# Patient Record
Sex: Male | Born: 1970 | Race: White | Hispanic: No | Smoking: Never smoker
Health system: Southern US, Community
[De-identification: ages and names within clinical notes are randomized; demographics above are authoritative.]

## PROBLEM LIST (undated history)

## (undated) DIAGNOSIS — K219 Gastro-esophageal reflux disease without esophagitis: Secondary | ICD-10-CM

## (undated) DIAGNOSIS — K209 Esophagitis, unspecified without bleeding: Secondary | ICD-10-CM

## (undated) DIAGNOSIS — K259 Gastric ulcer, unspecified as acute or chronic, without hemorrhage or perforation: Secondary | ICD-10-CM

## (undated) DIAGNOSIS — I10 Essential (primary) hypertension: Secondary | ICD-10-CM

## (undated) HISTORY — PX: HERNIA REPAIR: SHX51

---

## 2000-09-26 ENCOUNTER — Emergency Department (HOSPITAL_COMMUNITY): Admission: EM | Admit: 2000-09-26 | Discharge: 2000-09-26 | Payer: Self-pay | Admitting: Emergency Medicine

## 2000-10-01 ENCOUNTER — Encounter: Payer: Self-pay | Admitting: *Deleted

## 2000-10-01 ENCOUNTER — Emergency Department (HOSPITAL_COMMUNITY): Admission: EM | Admit: 2000-10-01 | Discharge: 2000-10-01 | Payer: Self-pay | Admitting: Emergency Medicine

## 2001-07-30 ENCOUNTER — Ambulatory Visit (HOSPITAL_COMMUNITY): Admission: RE | Admit: 2001-07-30 | Discharge: 2001-07-30 | Payer: Self-pay | Admitting: Internal Medicine

## 2001-07-30 ENCOUNTER — Encounter: Payer: Self-pay | Admitting: Internal Medicine

## 2001-10-07 ENCOUNTER — Emergency Department (HOSPITAL_COMMUNITY): Admission: EM | Admit: 2001-10-07 | Discharge: 2001-10-08 | Payer: Self-pay | Admitting: Emergency Medicine

## 2002-06-18 ENCOUNTER — Emergency Department (HOSPITAL_COMMUNITY): Admission: EM | Admit: 2002-06-18 | Discharge: 2002-06-18 | Payer: Self-pay | Admitting: *Deleted

## 2002-09-16 ENCOUNTER — Encounter: Payer: Self-pay | Admitting: *Deleted

## 2002-09-16 ENCOUNTER — Emergency Department (HOSPITAL_COMMUNITY): Admission: EM | Admit: 2002-09-16 | Discharge: 2002-09-16 | Payer: Self-pay | Admitting: *Deleted

## 2003-04-29 ENCOUNTER — Emergency Department (HOSPITAL_COMMUNITY): Admission: EM | Admit: 2003-04-29 | Discharge: 2003-04-30 | Payer: Self-pay | Admitting: *Deleted

## 2005-01-17 ENCOUNTER — Emergency Department: Payer: Self-pay | Admitting: Unknown Physician Specialty

## 2007-03-06 ENCOUNTER — Emergency Department (HOSPITAL_COMMUNITY): Admission: EM | Admit: 2007-03-06 | Discharge: 2007-03-06 | Payer: Self-pay | Admitting: Emergency Medicine

## 2007-08-17 ENCOUNTER — Emergency Department (HOSPITAL_COMMUNITY): Admission: EM | Admit: 2007-08-17 | Discharge: 2007-08-17 | Payer: Self-pay | Admitting: Emergency Medicine

## 2008-07-08 ENCOUNTER — Emergency Department (HOSPITAL_COMMUNITY): Admission: EM | Admit: 2008-07-08 | Discharge: 2008-07-08 | Payer: Self-pay | Admitting: Emergency Medicine

## 2009-07-25 ENCOUNTER — Emergency Department: Payer: Self-pay | Admitting: Emergency Medicine

## 2009-08-16 ENCOUNTER — Emergency Department (HOSPITAL_COMMUNITY): Admission: EM | Admit: 2009-08-16 | Discharge: 2009-08-16 | Payer: Self-pay | Admitting: Emergency Medicine

## 2009-09-20 ENCOUNTER — Inpatient Hospital Stay (HOSPITAL_COMMUNITY): Admission: AC | Admit: 2009-09-20 | Discharge: 2009-09-22 | Payer: Self-pay

## 2010-08-17 LAB — BLOOD GAS, ARTERIAL
Acid-Base Excess: 2.5 mmol/L — ABNORMAL HIGH (ref 0.0–2.0)
Acid-base deficit: 3.8 mmol/L — ABNORMAL HIGH (ref 0.0–2.0)
Bicarbonate: 20.3 mEq/L (ref 20.0–24.0)
Bicarbonate: 26.7 mEq/L — ABNORMAL HIGH (ref 20.0–24.0)
Drawn by: 296031
Drawn by: 317771
FIO2: 0.3 %
FIO2: 30 %
MECHVT: 550 mL
O2 Saturation: 98.5 %
O2 Saturation: 98.7 %
PEEP: 5 cmH2O
PEEP: 5 cmH2O
Patient temperature: 100
Patient temperature: 98.6
Pressure support: 5 cmH2O
RATE: 16 resp/min
TCO2: 21.4 mmol/L (ref 0–100)
TCO2: 28 mmol/L (ref 0–100)
pCO2 arterial: 36.1 mmHg (ref 35.0–45.0)
pCO2 arterial: 42.4 mmHg (ref 35.0–45.0)
pH, Arterial: 7.374 (ref 7.350–7.450)
pH, Arterial: 7.416 (ref 7.350–7.450)
pO2, Arterial: 106 mmHg — ABNORMAL HIGH (ref 80.0–100.0)
pO2, Arterial: 88.7 mmHg (ref 80.0–100.0)

## 2010-08-17 LAB — POCT I-STAT, CHEM 8
BUN: 10 mg/dL (ref 6–23)
Calcium, Ion: 1.04 mmol/L — ABNORMAL LOW (ref 1.12–1.32)
Chloride: 105 meq/L (ref 96–112)
Creatinine, Ser: 0.9 mg/dL (ref 0.4–1.5)
Glucose, Bld: 126 mg/dL — ABNORMAL HIGH (ref 70–99)
HCT: 46 % (ref 39.0–52.0)
Hemoglobin: 15.6 g/dL (ref 13.0–17.0)
Potassium: 3.8 mEq/L (ref 3.5–5.1)
Sodium: 143 mEq/L (ref 135–145)
TCO2: 26 mmol/L (ref 0–100)

## 2010-08-17 LAB — DIFFERENTIAL
Basophils Absolute: 0 10*3/uL (ref 0.0–0.1)
Basophils Relative: 0 % (ref 0–1)
Eosinophils Absolute: 0 10*3/uL (ref 0.0–0.7)
Eosinophils Relative: 0 % (ref 0–5)
Lymphocytes Relative: 13 % (ref 12–46)
Lymphs Abs: 2 10*3/uL (ref 0.7–4.0)
Monocytes Absolute: 1.1 10*3/uL — ABNORMAL HIGH (ref 0.1–1.0)
Monocytes Relative: 7 % (ref 3–12)
Neutro Abs: 12.5 10*3/uL — ABNORMAL HIGH (ref 1.7–7.7)
Neutrophils Relative %: 80 % — ABNORMAL HIGH (ref 43–77)

## 2010-08-17 LAB — COMPREHENSIVE METABOLIC PANEL
ALT: 13 U/L (ref 0–53)
AST: 26 U/L (ref 0–37)
Albumin: 4 g/dL (ref 3.5–5.2)
Alkaline Phosphatase: 49 U/L (ref 39–117)
BUN: 8 mg/dL (ref 6–23)
CO2: 26 mEq/L (ref 19–32)
Calcium: 8.3 mg/dL — ABNORMAL LOW (ref 8.4–10.5)
Chloride: 106 mEq/L (ref 96–112)
Creatinine, Ser: 0.83 mg/dL (ref 0.4–1.5)
GFR calc Af Amer: >60 mL/min (ref >60)
GFR calc non Af Amer: >60 mL/min (ref >60)
Glucose, Bld: 130 mg/dL — ABNORMAL HIGH (ref 70–99)
Potassium: 3.7 mEq/L (ref 3.5–5.1)
Sodium: 140 mEq/L (ref 135–145)
Total Bilirubin: 0.6 mg/dL (ref 0.3–1.2)
Total Protein: 7 g/dL (ref 6.0–8.3)

## 2010-08-17 LAB — BASIC METABOLIC PANEL
BUN: 8 mg/dL (ref 6–23)
CO2: 26 mEq/L (ref 19–32)
Calcium: 8.6 mg/dL (ref 8.4–10.5)
Chloride: 106 mEq/L (ref 96–112)
Creatinine, Ser: 0.78 mg/dL (ref 0.4–1.5)
GFR calc Af Amer: >60 mL/min (ref >60)
GFR calc non Af Amer: >60 mL/min (ref >60)
Glucose, Bld: 107 mg/dL — ABNORMAL HIGH (ref 70–99)
Potassium: 4.2 mEq/L (ref 3.5–5.1)
Sodium: 143 mEq/L (ref 135–145)

## 2010-08-17 LAB — CBC
HCT: 38.2 % — ABNORMAL LOW (ref 39.0–52.0)
HCT: 41.4 % (ref 39.0–52.0)
Hemoglobin: 13.4 g/dL (ref 13.0–17.0)
Hemoglobin: 13.7 g/dL (ref 13.0–17.0)
MCHC: 33.2 g/dL (ref 30.0–36.0)
MCHC: 35.2 g/dL (ref 30.0–36.0)
MCV: 85.2 fL (ref 78.0–100.0)
MCV: 86.1 fL (ref 78.0–100.0)
Platelets: 295 10*3/uL (ref 150–400)
Platelets: 327 10*3/uL (ref 150–400)
RBC: 4.48 MIL/uL (ref 4.22–5.81)
RBC: 4.8 MIL/uL (ref 4.22–5.81)
RDW: 14.7 % (ref 11.5–15.5)
RDW: 14.9 % (ref 11.5–15.5)
WBC: 15.6 10*3/uL — ABNORMAL HIGH (ref 4.0–10.5)
WBC: 18.8 10*3/uL — ABNORMAL HIGH (ref 4.0–10.5)

## 2010-08-17 LAB — MRSA PCR SCREENING: MRSA by PCR: NEGATIVE

## 2010-08-17 LAB — URINALYSIS, ROUTINE W REFLEX MICROSCOPIC
Bilirubin Urine: NEGATIVE
Glucose, UA: NEGATIVE mg/dL
Hgb urine dipstick: NEGATIVE
Ketones, ur: NEGATIVE mg/dL
Nitrite: NEGATIVE
Protein, ur: NEGATIVE mg/dL
Specific Gravity, Urine: 1.013 (ref 1.005–1.030)
Urobilinogen, UA: 0.2 mg/dL (ref 0.0–1.0)
pH: 5 (ref 5.0–8.0)

## 2010-08-17 LAB — RAPID URINE DRUG SCREEN, HOSP PERFORMED
Amphetamines: NOT DETECTED
Barbiturates: NOT DETECTED
Benzodiazepines: POSITIVE — AB
Cocaine: NOT DETECTED
Opiates: NOT DETECTED
Tetrahydrocannabinol: NOT DETECTED

## 2010-08-17 LAB — PROTIME-INR
INR: 1 (ref 0.00–1.49)
Prothrombin Time: 13.1 seconds (ref 11.6–15.2)

## 2010-08-17 LAB — ETHANOL: Alcohol, Ethyl (B): 192 mg/dL — ABNORMAL HIGH (ref 0–10)

## 2010-08-17 LAB — APTT: aPTT: 23 seconds — ABNORMAL LOW (ref 24–37)

## 2010-08-17 LAB — LACTIC ACID, PLASMA: Lactic Acid, Venous: 6 mmol/L — ABNORMAL HIGH (ref 0.5–2.2)

## 2010-09-14 LAB — RAPID URINE DRUG SCREEN, HOSP PERFORMED
Amphetamines: NOT DETECTED
Barbiturates: NOT DETECTED
Benzodiazepines: POSITIVE — AB
Cocaine: NOT DETECTED
Opiates: NOT DETECTED
Tetrahydrocannabinol: NOT DETECTED

## 2010-09-14 LAB — COMPREHENSIVE METABOLIC PANEL
ALT: 21 U/L (ref 0–53)
AST: 31 U/L (ref 0–37)
Albumin: 4.4 g/dL (ref 3.5–5.2)
Alkaline Phosphatase: 66 U/L (ref 39–117)
BUN: 11 mg/dL (ref 6–23)
CO2: 29 mEq/L (ref 19–32)
Calcium: 10 mg/dL (ref 8.4–10.5)
Chloride: 96 mEq/L (ref 96–112)
Creatinine, Ser: 0.84 mg/dL (ref 0.4–1.5)
GFR calc Af Amer: >60 mL/min (ref >60)
GFR calc non Af Amer: >60 mL/min (ref >60)
Glucose, Bld: 123 mg/dL — ABNORMAL HIGH (ref 70–99)
Potassium: 3.8 mEq/L (ref 3.5–5.1)
Sodium: 137 mEq/L (ref 135–145)
Total Bilirubin: 1 mg/dL (ref 0.3–1.2)
Total Protein: 7.8 g/dL (ref 6.0–8.3)

## 2010-09-14 LAB — GLUCOSE, CAPILLARY: Glucose-Capillary: 118 mg/dL — ABNORMAL HIGH (ref 70–99)

## 2010-09-14 LAB — ETHANOL: Alcohol, Ethyl (B): <5 mg/dL (ref 0–10)

## 2010-09-14 LAB — URINALYSIS, ROUTINE W REFLEX MICROSCOPIC
Bilirubin Urine: NEGATIVE
Glucose, UA: NEGATIVE mg/dL
Ketones, ur: NEGATIVE mg/dL
Leukocytes, UA: NEGATIVE
Nitrite: NEGATIVE
Protein, ur: NEGATIVE mg/dL
Specific Gravity, Urine: >1.03 — ABNORMAL HIGH (ref 1.005–1.030)
Urobilinogen, UA: 0.2 mg/dL (ref 0.0–1.0)
pH: 5 (ref 5.0–8.0)

## 2010-09-14 LAB — CBC
HCT: 48 % (ref 39.0–52.0)
Hemoglobin: 16.5 g/dL (ref 13.0–17.0)
MCHC: 34.4 g/dL (ref 30.0–36.0)
MCV: 88.4 fL (ref 78.0–100.0)
Platelets: 358 10*3/uL (ref 150–400)
RBC: 5.44 MIL/uL (ref 4.22–5.81)
RDW: 14.1 % (ref 11.5–15.5)
WBC: 11.7 10*3/uL — ABNORMAL HIGH (ref 4.0–10.5)

## 2010-09-14 LAB — URINE MICROSCOPIC-ADD ON

## 2010-09-14 LAB — DIFFERENTIAL
Basophils Absolute: 0 10*3/uL (ref 0.0–0.1)
Basophils Relative: 0 % (ref 0–1)
Eosinophils Absolute: 0 10*3/uL (ref 0.0–0.7)
Eosinophils Relative: 0 % (ref 0–5)
Lymphocytes Relative: 10 % — ABNORMAL LOW (ref 12–46)
Lymphs Abs: 1.2 10*3/uL (ref 0.7–4.0)
Monocytes Absolute: 0.6 10*3/uL (ref 0.1–1.0)
Monocytes Relative: 5 % (ref 3–12)
Neutro Abs: 9.9 10*3/uL — ABNORMAL HIGH (ref 1.7–7.7)
Neutrophils Relative %: 84 % — ABNORMAL HIGH (ref 43–77)

## 2011-02-21 LAB — DIFFERENTIAL
Basophils Absolute: 0
Basophils Relative: 0
Eosinophils Absolute: 0
Eosinophils Relative: 0
Lymphocytes Relative: 3 — ABNORMAL LOW
Lymphs Abs: 0.6 — ABNORMAL LOW
Monocytes Absolute: 0.5
Monocytes Relative: 3
Neutro Abs: 18.9 — ABNORMAL HIGH
Neutrophils Relative %: 94 — ABNORMAL HIGH

## 2011-02-21 LAB — BASIC METABOLIC PANEL
BUN: 11
CO2: 25
Calcium: 9.6
Chloride: 102
Creatinine, Ser: 0.98
GFR calc Af Amer: >60
GFR calc non Af Amer: >60
Glucose, Bld: 119 — ABNORMAL HIGH
Potassium: 4
Sodium: 137

## 2011-02-21 LAB — POCT CARDIAC MARKERS
CKMB, poc: <1 — ABNORMAL LOW
Myoglobin, poc: 35.2
Operator id: 286941
Troponin i, poc: <0.05

## 2011-02-21 LAB — CBC
HCT: 41.9
Hemoglobin: 14.7
MCHC: 35.1
MCV: 86.3
Platelets: 305
RBC: 4.86
RDW: 14.9
WBC: 20.1 — ABNORMAL HIGH

## 2011-03-28 ENCOUNTER — Emergency Department (HOSPITAL_COMMUNITY)
Admission: EM | Admit: 2011-03-28 | Discharge: 2011-03-29 | Discharge status: Against Medical Advice | Payer: Self-pay | Attending: Emergency Medicine | Admitting: Emergency Medicine

## 2011-03-28 DIAGNOSIS — Z5320 Procedure and treatment not carried out because of patient's decision for unspecified reasons: Secondary | ICD-10-CM | POA: Insufficient documentation

## 2011-03-28 DIAGNOSIS — G4700 Insomnia, unspecified: Secondary | ICD-10-CM | POA: Insufficient documentation

## 2011-03-28 DIAGNOSIS — R059 Cough, unspecified: Secondary | ICD-10-CM | POA: Insufficient documentation

## 2011-03-28 DIAGNOSIS — R05 Cough: Secondary | ICD-10-CM | POA: Insufficient documentation

## 2011-03-29 ENCOUNTER — Encounter: Payer: Self-pay | Admitting: *Deleted

## 2011-03-29 NOTE — ED Notes (Signed)
Cough x1 month and insomnia

## 2011-04-27 ENCOUNTER — Encounter (HOSPITAL_COMMUNITY): Payer: Self-pay | Admitting: Emergency Medicine

## 2011-04-27 ENCOUNTER — Emergency Department (HOSPITAL_COMMUNITY)
Admission: EM | Admit: 2011-04-27 | Discharge: 2011-04-27 | Disposition: A | Discharge status: Home / Self Care | Payer: Self-pay | Attending: Emergency Medicine | Admitting: Emergency Medicine

## 2011-04-27 DIAGNOSIS — R197 Diarrhea, unspecified: Secondary | ICD-10-CM | POA: Insufficient documentation

## 2011-04-27 DIAGNOSIS — R1013 Epigastric pain: Secondary | ICD-10-CM | POA: Insufficient documentation

## 2011-04-27 DIAGNOSIS — R5381 Other malaise: Secondary | ICD-10-CM | POA: Insufficient documentation

## 2011-04-27 DIAGNOSIS — R112 Nausea with vomiting, unspecified: Secondary | ICD-10-CM | POA: Insufficient documentation

## 2011-04-27 DIAGNOSIS — K259 Gastric ulcer, unspecified as acute or chronic, without hemorrhage or perforation: Secondary | ICD-10-CM | POA: Insufficient documentation

## 2011-04-27 HISTORY — DX: Gastric ulcer, unspecified as acute or chronic, without hemorrhage or perforation: K25.9

## 2011-04-27 LAB — CBC
HCT: 46.8 % (ref 39.0–52.0)
Hemoglobin: 15.8 g/dL (ref 13.0–17.0)
MCH: 29.8 pg (ref 26.0–34.0)
MCHC: 33.8 g/dL (ref 30.0–36.0)
MCV: 88.3 fL (ref 78.0–100.0)
Platelets: 325 10*3/uL (ref 150–400)
RBC: 5.3 MIL/uL (ref 4.22–5.81)
RDW: 13.3 % (ref 11.5–15.5)
WBC: 8.3 10*3/uL (ref 4.0–10.5)

## 2011-04-27 LAB — DIFFERENTIAL
Basophils Absolute: 0 10*3/uL (ref 0.0–0.1)
Basophils Relative: 0 % (ref 0–1)
Eosinophils Absolute: 0 10*3/uL (ref 0.0–0.7)
Eosinophils Relative: 1 % (ref 0–5)
Lymphocytes Relative: 21 % (ref 12–46)
Lymphs Abs: 1.8 10*3/uL (ref 0.7–4.0)
Monocytes Absolute: 0.5 10*3/uL (ref 0.1–1.0)
Monocytes Relative: 6 % (ref 3–12)
Neutro Abs: 6 10*3/uL (ref 1.7–7.7)
Neutrophils Relative %: 72 % (ref 43–77)

## 2011-04-27 LAB — COMPREHENSIVE METABOLIC PANEL
ALT: 13 U/L (ref 0–53)
AST: 16 U/L (ref 0–37)
Albumin: 4.2 g/dL (ref 3.5–5.2)
Alkaline Phosphatase: 61 U/L (ref 39–117)
BUN: 12 mg/dL (ref 6–23)
CO2: 28 mEq/L (ref 19–32)
Calcium: 10 mg/dL (ref 8.4–10.5)
Chloride: 100 mEq/L (ref 96–112)
Creatinine, Ser: 0.77 mg/dL (ref 0.50–1.35)
GFR calc Af Amer: >90 mL/min (ref >90)
GFR calc non Af Amer: >90 mL/min (ref >90)
Glucose, Bld: 88 mg/dL (ref 70–99)
Potassium: 3.9 mEq/L (ref 3.5–5.1)
Sodium: 138 mEq/L (ref 135–145)
Total Bilirubin: 0.4 mg/dL (ref 0.3–1.2)
Total Protein: 7.8 g/dL (ref 6.0–8.3)

## 2011-04-27 LAB — LIPASE, BLOOD: Lipase: 35 U/L (ref 11–59)

## 2011-04-27 MED ORDER — SODIUM CHLORIDE 0.9 % IV BOLUS (SEPSIS)
1000.0000 mL | Freq: Once | INTRAVENOUS | Status: AC
  Administered 2011-04-27: 1000 mL via INTRAVENOUS

## 2011-04-27 MED ORDER — ONDANSETRON 8 MG PO TBDP: 8.0000 mg | ORAL_TABLET | Freq: Three times a day (TID) | ORAL | Status: AC | PRN

## 2011-04-27 MED ORDER — ONDANSETRON HCL 4 MG/2ML IJ SOLN
4.0000 mg | Freq: Once | INTRAMUSCULAR | Status: AC
  Administered 2011-04-27: 4 mg via INTRAVENOUS
  Filled 2011-04-27: qty 2

## 2011-04-27 NOTE — ED Notes (Signed)
Patient with c/o epigastric pain starting Sunday. History of ulcers. +n/v.

## 2011-04-27 NOTE — ED Provider Notes (Signed)
History     CSN: [id number] Arrival date & time: 04/27/2011  3:52 PM   First MD Initiated Contact with Patient 04/27/11 1554      Chief Complaint  Patient presents with  . Abdominal Pain    (Consider location/radiation/quality/duration/timing/severity/associated sxs/prior treatment) Patient is a 40 y.o. male presenting with abdominal pain. The history is provided by the patient.  Abdominal Pain The primary symptoms of the illness include abdominal pain, fatigue, nausea, vomiting and diarrhea. The primary symptoms of the illness do not include shortness of breath.  Symptoms associated with the illness do not include back pain.   patient developed nausea vomiting diarrhea. Insert on Friday. He developed more epigastric pain radiating to his chest on Sunday. He does have a history of ulcers. He states the diarrhea has slowed down and now is not able to eat due to nausea since. No fevers. He describes the pain as cramping and burning. No dyspnea. No diaphoresis. He states he has thrown up a little bit of blood. He is an occasional alcohol user, but not recently. The pain is constant.   Past Medical History  Diagnosis Date  . Stomach ulcer     Past Surgical History  Procedure Date  . Hernia repair     No family history on file.  History  Substance Use Topics  . Smoking status: Never Smoker   . Smokeless tobacco: Not on file  . Alcohol Use: Yes      Review of Systems  Constitutional: Positive for appetite change and fatigue. Negative for activity change.  HENT: Negative for neck stiffness.   Eyes: Negative for pain.  Respiratory: Negative for chest tightness and shortness of breath.   Cardiovascular: Negative for chest pain and leg swelling.  Gastrointestinal: Positive for nausea, vomiting, abdominal pain and diarrhea.  Genitourinary: Negative for flank pain.  Musculoskeletal: Negative for back pain.  Skin: Negative for rash.  Neurological: Negative for weakness,  numbness and headaches.  Psychiatric/Behavioral: Negative for behavioral problems.    Allergies  Review of patient's allergies indicates no known allergies.  Home Medications   Current Outpatient Rx  Name Route Sig Dispense Refill  . DIPHENHYDRAMINE-APAP (SLEEP) 25-500 MG PO TABS Oral Take 2 tablets by mouth at bedtime as needed. For sleep/pain     . ONDANSETRON 8 MG PO TBDP Oral Take 1 tablet (8 mg total) by mouth every 8 (eight) hours as needed for nausea. 20 tablet 0    BP 156/101  Pulse 89  Temp(Src) 98.2 F (36.8 C) (Oral)  Resp 18  Ht 6\' 1"  (1.854 m)  Wt 220 lb (99.791 kg)  BMI 29.03 kg/m2  SpO2 100%  Physical Exam  Nursing note and vitals reviewed. Constitutional: He is oriented to person, place, and time. He appears well-developed and well-nourished.  HENT:  Head: Normocephalic and atraumatic.  Neck: Normal range of motion. Neck supple.  Cardiovascular: Normal rate, regular rhythm and normal heart sounds.   No murmur heard. Pulmonary/Chest: Effort normal and breath sounds normal.  Abdominal: Soft. Bowel sounds are normal. He exhibits no distension and no mass. There is tenderness. There is no rebound and no guarding.       Mild epigastric tenderness  Musculoskeletal: Normal range of motion. He exhibits no edema.  Neurological: He is alert and oriented to person, place, and time. No cranial nerve deficit.  Skin: Skin is warm and dry.  Psychiatric: He has a normal mood and affect.    ED Course  Procedures (including critical  care time)   Labs Reviewed  CBC  DIFFERENTIAL  COMPREHENSIVE METABOLIC PANEL  LIPASE, BLOOD   No results found.   1. Nausea vomiting and diarrhea   2. Abdominal pain       MDM  Nausea vomiting diarrhea with epigastric pain. Laboratory reassuring. Patient feels better after IV fluids. He is tolerating orals here. He'll be discharged with oral Zofran.        Juliet Rude. Rubin Payor, MD 04/27/11 [phone]

## 2011-10-26 ENCOUNTER — Encounter (HOSPITAL_COMMUNITY): Payer: Self-pay | Admitting: Emergency Medicine

## 2011-10-26 ENCOUNTER — Inpatient Hospital Stay (HOSPITAL_COMMUNITY)
Admission: EM | Admit: 2011-10-26 | Discharge: 2011-10-28 | DRG: 392 | Disposition: A | Discharge status: Home / Self Care | Payer: MEDICAID | Attending: Internal Medicine | Admitting: Internal Medicine

## 2011-10-26 DIAGNOSIS — R918 Other nonspecific abnormal finding of lung field: Secondary | ICD-10-CM | POA: Diagnosis present

## 2011-10-26 DIAGNOSIS — K279 Peptic ulcer, site unspecified, unspecified as acute or chronic, without hemorrhage or perforation: Secondary | ICD-10-CM | POA: Diagnosis present

## 2011-10-26 DIAGNOSIS — Z9119 Patient's noncompliance with other medical treatment and regimen: Secondary | ICD-10-CM

## 2011-10-26 DIAGNOSIS — K209 Esophagitis, unspecified without bleeding: Principal | ICD-10-CM | POA: Diagnosis present

## 2011-10-26 DIAGNOSIS — Z91199 Patient's noncompliance with other medical treatment and regimen due to unspecified reason: Secondary | ICD-10-CM

## 2011-10-26 DIAGNOSIS — R000 Tachycardia, unspecified: Secondary | ICD-10-CM | POA: Diagnosis present

## 2011-10-26 DIAGNOSIS — K219 Gastro-esophageal reflux disease without esophagitis: Secondary | ICD-10-CM | POA: Diagnosis present

## 2011-10-26 DIAGNOSIS — I10 Essential (primary) hypertension: Secondary | ICD-10-CM | POA: Diagnosis present

## 2011-10-26 DIAGNOSIS — R911 Solitary pulmonary nodule: Secondary | ICD-10-CM

## 2011-10-26 DIAGNOSIS — R52 Pain, unspecified: Secondary | ICD-10-CM

## 2011-10-26 DIAGNOSIS — K920 Hematemesis: Secondary | ICD-10-CM | POA: Diagnosis present

## 2011-10-26 DIAGNOSIS — E860 Dehydration: Secondary | ICD-10-CM

## 2011-10-26 DIAGNOSIS — Z8711 Personal history of peptic ulcer disease: Secondary | ICD-10-CM

## 2011-10-26 DIAGNOSIS — E871 Hypo-osmolality and hyponatremia: Secondary | ICD-10-CM | POA: Diagnosis present

## 2011-10-26 HISTORY — DX: Essential (primary) hypertension: I10

## 2011-10-26 HISTORY — DX: Esophagitis, unspecified without bleeding: K20.90

## 2011-10-26 HISTORY — DX: Gastro-esophageal reflux disease without esophagitis: K21.9

## 2011-10-26 HISTORY — DX: Esophagitis, unspecified: K20.9

## 2011-10-26 NOTE — ED Notes (Signed)
Patient c/o not being able to eat and states went to bathroom tonight and had dark, black stool.

## 2011-10-27 ENCOUNTER — Emergency Department (HOSPITAL_COMMUNITY): Payer: Self-pay

## 2011-10-27 ENCOUNTER — Encounter (HOSPITAL_COMMUNITY)
Admission: EM | Disposition: A | Discharge status: Home / Self Care | Payer: Self-pay | Source: Home / Self Care | Attending: Internal Medicine

## 2011-10-27 ENCOUNTER — Encounter (HOSPITAL_COMMUNITY): Payer: Self-pay | Admitting: Internal Medicine

## 2011-10-27 DIAGNOSIS — K208 Other esophagitis without bleeding: Secondary | ICD-10-CM

## 2011-10-27 DIAGNOSIS — I10 Essential (primary) hypertension: Secondary | ICD-10-CM

## 2011-10-27 DIAGNOSIS — R918 Other nonspecific abnormal finding of lung field: Secondary | ICD-10-CM | POA: Diagnosis present

## 2011-10-27 DIAGNOSIS — K449 Diaphragmatic hernia without obstruction or gangrene: Secondary | ICD-10-CM

## 2011-10-27 DIAGNOSIS — K219 Gastro-esophageal reflux disease without esophagitis: Secondary | ICD-10-CM | POA: Diagnosis present

## 2011-10-27 DIAGNOSIS — K279 Peptic ulcer, site unspecified, unspecified as acute or chronic, without hemorrhage or perforation: Secondary | ICD-10-CM | POA: Diagnosis present

## 2011-10-27 DIAGNOSIS — E871 Hypo-osmolality and hyponatremia: Secondary | ICD-10-CM

## 2011-10-27 DIAGNOSIS — K920 Hematemesis: Secondary | ICD-10-CM | POA: Diagnosis present

## 2011-10-27 DIAGNOSIS — E860 Dehydration: Secondary | ICD-10-CM

## 2011-10-27 HISTORY — PX: ESOPHAGOGASTRODUODENOSCOPY: SHX5428

## 2011-10-27 LAB — CBC
HCT: 40.9 % (ref 39.0–52.0)
HCT: 43.8 % (ref 39.0–52.0)
Hemoglobin: 13.6 g/dL (ref 13.0–17.0)
Hemoglobin: 14.9 g/dL (ref 13.0–17.0)
MCH: 29.9 pg (ref 26.0–34.0)
MCH: 30.4 pg (ref 26.0–34.0)
MCHC: 33.3 g/dL (ref 30.0–36.0)
MCHC: 34 g/dL (ref 30.0–36.0)
MCV: 89.4 fL (ref 78.0–100.0)
MCV: 89.9 fL (ref 78.0–100.0)
Platelets: 298 10*3/uL (ref 150–400)
Platelets: 346 10*3/uL (ref 150–400)
RBC: 4.55 MIL/uL (ref 4.22–5.81)
RBC: 4.9 MIL/uL (ref 4.22–5.81)
RDW: 12.8 % (ref 11.5–15.5)
RDW: 12.9 % (ref 11.5–15.5)
WBC: 14.7 10*3/uL — ABNORMAL HIGH (ref 4.0–10.5)
WBC: 15.5 10*3/uL — ABNORMAL HIGH (ref 4.0–10.5)

## 2011-10-27 LAB — URINALYSIS, ROUTINE W REFLEX MICROSCOPIC
Bilirubin Urine: NEGATIVE
Glucose, UA: NEGATIVE mg/dL
Hgb urine dipstick: NEGATIVE
Ketones, ur: NEGATIVE mg/dL
Leukocytes, UA: NEGATIVE
Nitrite: NEGATIVE
Protein, ur: NEGATIVE mg/dL
Specific Gravity, Urine: <1.005 — ABNORMAL LOW (ref 1.005–1.030)
Urobilinogen, UA: 0.2 mg/dL (ref 0.0–1.0)
pH: 7 (ref 5.0–8.0)

## 2011-10-27 LAB — PROTIME-INR
INR: 0.91 (ref 0.00–1.49)
Prothrombin Time: 12.5 seconds (ref 11.6–15.2)

## 2011-10-27 LAB — HEPATIC FUNCTION PANEL
ALT: 12 U/L (ref 0–53)
AST: 15 U/L (ref 0–37)
Albumin: 3.6 g/dL (ref 3.5–5.2)
Alkaline Phosphatase: 63 U/L (ref 39–117)
Bilirubin, Direct: 0.1 mg/dL (ref 0.0–0.3)
Indirect Bilirubin: 0.4 mg/dL (ref 0.3–0.9)
Total Bilirubin: 0.5 mg/dL (ref 0.3–1.2)
Total Protein: 7.3 g/dL (ref 6.0–8.3)

## 2011-10-27 LAB — BASIC METABOLIC PANEL
BUN: 7 mg/dL (ref 6–23)
CO2: 26 mEq/L (ref 19–32)
Calcium: 9.8 mg/dL (ref 8.4–10.5)
Chloride: 89 mEq/L — ABNORMAL LOW (ref 96–112)
Creatinine, Ser: 0.69 mg/dL (ref 0.50–1.35)
GFR calc Af Amer: >90 mL/min (ref >90)
GFR calc non Af Amer: >90 mL/min (ref >90)
Glucose, Bld: 102 mg/dL — ABNORMAL HIGH (ref 70–99)
Potassium: 4.2 mEq/L (ref 3.5–5.1)
Sodium: 129 mEq/L — ABNORMAL LOW (ref 135–145)

## 2011-10-27 LAB — DIFFERENTIAL
Basophils Absolute: 0 10*3/uL (ref 0.0–0.1)
Basophils Relative: 0 % (ref 0–1)
Eosinophils Absolute: 0 10*3/uL (ref 0.0–0.7)
Eosinophils Relative: 0 % (ref 0–5)
Lymphocytes Relative: 7 % — ABNORMAL LOW (ref 12–46)
Lymphs Abs: 1 10*3/uL (ref 0.7–4.0)
Monocytes Absolute: 0.4 10*3/uL (ref 0.1–1.0)
Monocytes Relative: 3 % (ref 3–12)
Neutro Abs: 13.3 10*3/uL — ABNORMAL HIGH (ref 1.7–7.7)
Neutrophils Relative %: 90 % — ABNORMAL HIGH (ref 43–77)

## 2011-10-27 LAB — LIPASE, BLOOD: Lipase: 26 U/L (ref 11–59)

## 2011-10-27 LAB — APTT: aPTT: 28 seconds (ref 24–37)

## 2011-10-27 LAB — ETHANOL: Alcohol, Ethyl (B): 63 mg/dL — ABNORMAL HIGH (ref 0–11)

## 2011-10-27 SURGERY — EGD (ESOPHAGOGASTRODUODENOSCOPY)
Anesthesia: Moderate Sedation

## 2011-10-27 MED ORDER — SODIUM CHLORIDE 0.9 % IV BOLUS (SEPSIS)
1000.0000 mL | Freq: Once | INTRAVENOUS | Status: AC
  Administered 2011-10-27: 1000 mL via INTRAVENOUS

## 2011-10-27 MED ORDER — SODIUM CHLORIDE 0.9 % IV SOLN: INTRAVENOUS | Status: AC

## 2011-10-27 MED ORDER — FLEET ENEMA 7-19 GM/118ML RE ENEM: 1.0000 | ENEMA | Freq: Once | RECTAL | Status: AC | PRN

## 2011-10-27 MED ORDER — STERILE WATER FOR IRRIGATION IR SOLN
Status: DC | PRN
  Administered 2011-10-27: 15:00:00

## 2011-10-27 MED ORDER — MIDAZOLAM HCL 5 MG/5ML IJ SOLN
INTRAMUSCULAR | Status: AC
  Filled 2011-10-27: qty 5

## 2011-10-27 MED ORDER — HYDROMORPHONE HCL PF 1 MG/ML IJ SOLN
1.0000 mg | Freq: Once | INTRAMUSCULAR | Status: AC
  Administered 2011-10-27: 1 mg via INTRAVENOUS
  Filled 2011-10-27: qty 1

## 2011-10-27 MED ORDER — POTASSIUM CHLORIDE IN NACL 20-0.9 MEQ/L-% IV SOLN
INTRAVENOUS | Status: DC
  Administered 2011-10-27: 18:00:00 via INTRAVENOUS

## 2011-10-27 MED ORDER — IOHEXOL 300 MG/ML  SOLN
100.0000 mL | Freq: Once | INTRAMUSCULAR | Status: AC | PRN
  Administered 2011-10-27: 100 mL via INTRAVENOUS

## 2011-10-27 MED ORDER — SODIUM CHLORIDE 0.9 % IJ SOLN
INTRAMUSCULAR | Status: AC
  Filled 2011-10-27: qty 10

## 2011-10-27 MED ORDER — MIDAZOLAM HCL 5 MG/5ML IJ SOLN
INTRAMUSCULAR | Status: DC | PRN
  Administered 2011-10-27 (×4): 2 mg via INTRAVENOUS

## 2011-10-27 MED ORDER — PROMETHAZINE HCL 25 MG/ML IJ SOLN
INTRAMUSCULAR | Status: AC
  Filled 2011-10-27: qty 1

## 2011-10-27 MED ORDER — SODIUM CHLORIDE 0.45 % IV SOLN
Freq: Once | INTRAVENOUS | Status: AC
  Administered 2011-10-27: 1000 mL via INTRAVENOUS

## 2011-10-27 MED ORDER — HYDRALAZINE HCL 20 MG/ML IJ SOLN: 2.0000 mg | INTRAMUSCULAR | Status: DC | PRN

## 2011-10-27 MED ORDER — PANTOPRAZOLE SODIUM 40 MG IV SOLR: 40.0000 mg | Freq: Once | INTRAVENOUS | Status: AC

## 2011-10-27 MED ORDER — PANTOPRAZOLE SODIUM 40 MG IV SOLR
40.0000 mg | Freq: Once | INTRAVENOUS | Status: AC
  Administered 2011-10-27: 40 mg via INTRAVENOUS
  Filled 2011-10-27: qty 40

## 2011-10-27 MED ORDER — MEPERIDINE HCL 100 MG/ML IJ SOLN
INTRAMUSCULAR | Status: AC
  Filled 2011-10-27: qty 1

## 2011-10-27 MED ORDER — MORPHINE SULFATE 4 MG/ML IJ SOLN
4.0000 mg | Freq: Once | INTRAMUSCULAR | Status: AC
  Administered 2011-10-27: 4 mg via INTRAVENOUS
  Filled 2011-10-27: qty 1

## 2011-10-27 MED ORDER — ONDANSETRON HCL 4 MG/2ML IJ SOLN
4.0000 mg | INTRAMUSCULAR | Status: DC | PRN
  Administered 2011-10-27: 4 mg via INTRAVENOUS
  Filled 2011-10-27: qty 2

## 2011-10-27 MED ORDER — PROMETHAZINE HCL 25 MG/ML IJ SOLN
INTRAMUSCULAR | Status: DC | PRN
  Administered 2011-10-27 (×2): 12.5 mg via INTRAVENOUS

## 2011-10-27 MED ORDER — BUTAMBEN-TETRACAINE-BENZOCAINE 2-2-14 % EX AERO
INHALATION_SPRAY | CUTANEOUS | Status: DC | PRN
  Administered 2011-10-27: 2 via TOPICAL

## 2011-10-27 MED ORDER — PANTOPRAZOLE SODIUM 40 MG IV SOLR
40.0000 mg | Freq: Two times a day (BID) | INTRAVENOUS | Status: DC
  Administered 2011-10-27 – 2011-10-28 (×2): 40 mg via INTRAVENOUS
  Filled 2011-10-27 (×2): qty 40

## 2011-10-27 MED ORDER — HYDROMORPHONE HCL PF 1 MG/ML IJ SOLN
0.5000 mg | INTRAMUSCULAR | Status: DC | PRN
  Administered 2011-10-27 – 2011-10-28 (×10): 1 mg via INTRAVENOUS
  Filled 2011-10-27 (×10): qty 1

## 2011-10-27 MED ORDER — MEPERIDINE HCL 100 MG/ML IJ SOLN
INTRAMUSCULAR | Status: DC | PRN
  Administered 2011-10-27: 50 mg via INTRAVENOUS
  Administered 2011-10-27: 25 mg via INTRAVENOUS
  Administered 2011-10-27: 50 mg via INTRAVENOUS
  Administered 2011-10-27: 25 mg via INTRAVENOUS

## 2011-10-27 MED ORDER — BISACODYL 10 MG RE SUPP: 10.0000 mg | Freq: Every day | RECTAL | Status: DC | PRN

## 2011-10-27 MED ORDER — MIDAZOLAM HCL 5 MG/5ML IJ SOLN
INTRAMUSCULAR | Status: AC
  Filled 2011-10-27: qty 10

## 2011-10-27 MED ORDER — PANTOPRAZOLE SODIUM 40 MG IV SOLR
40.0000 mg | Freq: Two times a day (BID) | INTRAVENOUS | Status: DC
  Administered 2011-10-27: 40 mg via INTRAVENOUS
  Filled 2011-10-27: qty 40

## 2011-10-27 NOTE — Progress Notes (Signed)
cm

## 2011-10-27 NOTE — Consult Note (Signed)
  Primary Care Physician:  No primary provider on file. Primary Gastroenterologist:  Dr. Darrick Penna  Pre-Procedure History & Physical: HPI:  Timothy Novak is a 41 y.o. male here for  HEMATEMESIS. GERD FOR 14 YEARS NO PREVACID FOR 12 YEARS. SAW STREAKS OF BLOOD YESTERDAY 1X-STREAKS. RARE ETOH-LAST TIME ETOH. NO CIGS. NO OTHER MEDS FOR REFLUX. sX 3-4X/WEEK-BURNING IN ESOPHAGUS AND STOMACH FEELS LIKE IT'S SWELLING/PRESSURE. NO ASAS, BC, GOODY'S, ALEVE, OR IBUPROFEN. ONLY USES TYLENOL PM. NO WEIGHT LOSS.NO PROBLEMS SWALLOWING.   Past Medical History  Diagnosis Date  . Stomach ulcer age 56  . GERD (gastroesophageal reflux disease) age 72  . Hypertension     Past Surgical History  Procedure Date  . Hernia repair RIGHT    Allergies as of 10/26/2011  . (No Known Allergies)    Family History  Problem Relation Age of Onset  . Dementia Mother   . Cancer Father 15    lung  . Cancer Sister 57    ob/gyn  . Hypertension Sister   . Hypertension Father   . Heart failure Mother   . Hypertension Mother   . Diabetes Mother    NO COLON CA/POLYPS  History   Social History  . Marital Status: Divorced    Spouse Name: N/A    Number of Children: 1  . Years of Education: N/A   Occupational History  . floor cutter    Social History Main Topics  . Smoking status: Never Smoker   . Smokeless tobacco: Not on file  . Alcohol Use: 2.5 oz/week    5 drink(s) per week     occ  . Drug Use: No  . Sexually Active: Yes     Review of Systems: See HPI, otherwise negative ROS   Physical Exam: BP 146/90  Pulse 90  Temp(Src) 97.9 F (36.6 C) (Oral)  Resp 18  Ht 6\' 1"  (1.854 m)  Wt 215 lb (97.523 kg)  BMI 28.37 kg/m2  SpO2 96% General:   Alert,  pleasant and cooperative in NAD Head:  Normocephalic and atraumatic. Neck:  Supple;  Lungs:  Clear throughout to auscultation.    Heart:  Regular rate and rhythm. Abdomen:  Soft, nontender and nondistended. Normal bowel sounds, without guarding,  and without rebound.   Neurologic:  Alert and  oriented x4;  grossly normal neurologically.  Impression/Plan:    UNCONTROLLED REFLUX-ABNL CT. SMALL VOLUME HEMATEMESIS  PLAN: 1. EGD TODAY

## 2011-10-27 NOTE — Clinical Social Work Note (Signed)
Consult to social work was because patient could not afford medications.  Consult given to RN CM to address. Pt would like referral to [facility].  Patient states he has been working for 8 months, insurance will begin on October 29, 2011.  Patient states he has no support when her returns home, no family or friends in the area to assist.  Will continue to assess.  Clovis Cao Clinical Social Worker [phone])

## 2011-10-27 NOTE — H&P (Signed)
PCP:   No primary provider on file.   Chief Complaint:  Vomiting since today  HPI: Timothy Novak is an 41 y.o. male.   10 year diagnosis of hypertension noncompliant with medical therapy for financial reasons, also history of stomach ulcers and GERD, similarly noncompliant, with consequent episodic stomach pains whenever he eats certain foods. Drinks typically 5 beers per weekend. Last alcohol use, 2 beers Memorial Day.  He has a remote history of heavier alcohol use.  Onset of severe epigastric abdominal pain unrelenting for the past 3 days, associated with vomiting x3 times yesterday. Initially vomitus was pinkish bloodstained fluid, then large amounts of black vomitus x2. Denies bilious material, denies bloody or melena stool. No stool passed the past 2 days. Been checking to the emergency room for evaluation because of his severe abdominal pain. Denies use of Advil, Aleve aspirin or any over-the-counter NSAIDs.  Some of the chronic dizziness with standing which she associates with his untreated hypertension.  Checks his blood pressure at the pharmacy episodically and his systolics range between 180 and 120.  In the emergency room patient has remained persistently tachycardic despite repeated boluses of IV fluid.   Rewiew of Systems:  The patient denies anorexia, fever, weight loss,, vision loss, decreased hearing, hoarseness, chest pain, syncope, dyspnea on exertion, peripheral edema, balance deficits,  melena, hematochezia, severe indigestion/heartburn, hematuria, incontinence, genital sores, muscle weakness, suspicious skin lesions, transient blindness, difficulty walking, depression, unusual weight change, abnormal bleeding, enlarged lymph nodes, angioedema, and breast masses.   Past Medical History  Diagnosis Date  . Stomach ulcer age 26  . GERD (gastroesophageal reflux disease) age 6  . Hypertension     Past Surgical History  Procedure Date  . Hernia repair      Medications:  HOME MEDS: Prior to Admission medications   Medication Sig Start Date End Date Taking? Authorizing Provider  diphenhydramine-acetaminophen (TYLENOL PM) 25-500 MG TABS Take 2 tablets by mouth at bedtime as needed. For sleep/pain    Yes Historical Provider, MD     Allergies:  No Known Allergies  Social History:   reports that he has never smoked. He does not have any smokeless tobacco history on file. He reports that he drinks about 2.5 ounces of alcohol per week. He reports that he does not use illicit drugs.  Family History: Family History  Problem Relation Age of Onset  . Dementia Mother   . Cancer Father 15    lung  . Cancer Sister 57    ob/gyn  . Hypertension Sister   . Hypertension Father   . Heart failure Mother   . Hypertension Mother   . Diabetes Mother      Physical Exam: Filed Vitals:   10/27/11 0034 10/27/11 0224 10/27/11 0425 10/27/11 0526  BP: 97/61 144/92 162/98 162/97  Pulse: 135 122 110 110  Temp:  99.9 F (37.7 C) 99.2 F (37.3 C)   TempSrc:  Oral Oral   Resp:  16 16 18   Height:      Weight:      SpO2:  98% 96% 97%   Blood pressure 162/97, pulse 110, temperature 99.2 F (37.3 C), temperature source Oral, resp. rate 18, height 6\' 1"  (1.854 m), weight 97.523 kg (215 lb), SpO2 97.00%.  GEN:  Pleasant young Caucasian gentleman lying in the stretcher in mild painful distress; cooperative with exam PSYCH:  alert and oriented x4; does appear mildly anxious; affect is appropriate. HEENT: Mucous membranes pink and anicteric; PERRLA; EOM  intact; no cervical lymphadenopathy nor thyromegaly or carotid bruit; no JVD; Breasts:: Not examined CHEST WALL: No tenderness CHEST: Normal respiration, clear to auscultation bilaterally HEART: Tachycardic Regular rate and rhythm; no murmurs rubs or gallops BACK: No kyphosis or scoliosis; no CVA tenderness ABDOMEN: Obese, upper abdominal tenderness, no rebound; no masses, no organomegaly, normal  abdominal bowel sounds; no pannus;  Rectal Exam: Not done EXTREMITIES: No bone or joint deformity;  no edema; no ulcerations. Genitalia: not examined PULSES: 2+ and symmetric SKIN: Normal hydration no rash or ulceration CNS: Cranial nerves 2-12 grossly intact no focal lateralizing neurologic deficit   Labs & Imaging Results for orders placed during the hospital encounter of 10/26/11 (from the past 48 hour(s))  CBC     Status: Abnormal   Collection Time   10/27/11 12:16 AM      Component Value Range Comment   WBC 14.7 (*) 4.0 - 10.5 (K/uL)    RBC 4.90  4.22 - 5.81 (MIL/uL)    Hemoglobin 14.9  13.0 - 17.0 (g/dL)    HCT 16.1  09.6 - 04.5 (%)    MCV 89.4  78.0 - 100.0 (fL)    MCH 30.4  26.0 - 34.0 (pg)    MCHC 34.0  30.0 - 36.0 (g/dL)    RDW 40.9  81.1 - 91.4 (%)    Platelets 346  150 - 400 (K/uL)   DIFFERENTIAL     Status: Abnormal   Collection Time   10/27/11 12:16 AM      Component Value Range Comment   Neutrophils Relative 90 (*) 43 - 77 (%)    Neutro Abs 13.3 (*) 1.7 - 7.7 (K/uL)    Lymphocytes Relative 7 (*) 12 - 46 (%)    Lymphs Abs 1.0  0.7 - 4.0 (K/uL)    Monocytes Relative 3  3 - 12 (%)    Monocytes Absolute 0.4  0.1 - 1.0 (K/uL)    Eosinophils Relative 0  0 - 5 (%)    Eosinophils Absolute 0.0  0.0 - 0.7 (K/uL)    Basophils Relative 0  0 - 1 (%)    Basophils Absolute 0.0  0.0 - 0.1 (K/uL)   BASIC METABOLIC PANEL     Status: Abnormal   Collection Time   10/27/11 12:16 AM      Component Value Range Comment   Sodium 129 (*) 135 - 145 (mEq/L)    Potassium 4.2  3.5 - 5.1 (mEq/L)    Chloride 89 (*) 96 - 112 (mEq/L)    CO2 26  19 - 32 (mEq/L)    Glucose, Bld 102 (*) 70 - 99 (mg/dL)    BUN 7  6 - 23 (mg/dL)    Creatinine, Ser 7.82  0.50 - 1.35 (mg/dL)    Calcium 9.8  8.4 - 10.5 (mg/dL)    GFR calc non Af Amer >90  >90 (mL/min)    GFR calc Af Amer >90  >90 (mL/min)   HEPATIC FUNCTION PANEL     Status: Normal   Collection Time   10/27/11 12:24 AM      Component Value  Range Comment   Total Protein 7.3  6.0 - 8.3 (g/dL)    Albumin 3.6  3.5 - 5.2 (g/dL)    AST 15  0 - 37 (U/L)    ALT 12  0 - 53 (U/L)    Alkaline Phosphatase 63  39 - 117 (U/L)    Total Bilirubin 0.5  0.3 - 1.2 (mg/dL)  Bilirubin, Direct 0.1  0.0 - 0.3 (mg/dL)    Indirect Bilirubin 0.4  0.3 - 0.9 (mg/dL)   LIPASE, BLOOD     Status: Normal   Collection Time   10/27/11 12:24 AM      Component Value Range Comment   Lipase 26  11 - 59 (U/L)   CBC     Status: Abnormal   Collection Time   10/27/11  4:37 AM      Component Value Range Comment   WBC 15.5 (*) 4.0 - 10.5 (K/uL)    RBC 4.55  4.22 - 5.81 (MIL/uL)    Hemoglobin 13.6  13.0 - 17.0 (g/dL)    HCT 16.1  09.6 - 04.5 (%)    MCV 89.9  78.0 - 100.0 (fL)    MCH 29.9  26.0 - 34.0 (pg)    MCHC 33.3  30.0 - 36.0 (g/dL)    RDW 40.9  81.1 - 91.4 (%)    Platelets 298  150 - 400 (K/uL)    Ct Abdomen Pelvis W Contrast  10/27/2011  *RADIOLOGY REPORT*  Clinical Data: Upper abdominal pain.  Dark black stools.  History of stomach ulcer.  CT ABDOMEN AND PELVIS WITH CONTRAST  Technique:  Multidetector CT imaging of the abdomen and pelvis was performed following the standard protocol during bolus administration of intravenous contrast.  Contrast: OMNIPAQUE IOHEXOL 300 MG/ML  SOLN  Comparison: None.  Findings: Atelectasis or early infiltration in the right lung base. There are a few scattered pulmonary nodules in the right lung measuring up to about 4 mm diameter. If the patient is at high risk for bronchogenic carcinoma, follow-up chest CT at 1 year is recommended.  If the patient is at low risk, no follow-up is needed.  This recommendation follows the consensus statement: Guidelines for Management of Small Pulmonary Nodules Detected on CT Scans:  A Statement from the Fleischner Society as published in Radiology 2005; 237:395-400.  The distal esophagus demonstrates diffuse wall thickening and mild dilatation.  Changes suggest inflammatory process such as  reflux esophagitis.  Distal esophageal mass is not excluded.  There are a few small lymph nodes adjacent to the esophagus.  Consider endoscopy for further evaluation.  The stomach is mildly distended.  Mild thickening of the pyloric region of the stomach might represent gastritis, ulcer disease, or normal nondistension due to peristalsis.  The liver, spleen, gallbladder, pancreas, adrenal glands, abdominal aorta, and retroperitoneal lymph nodes are unremarkable.  Bilateral renal parenchymal cysts.  No solid mass or hydronephrosis in the kidneys.  No free air or free fluid in the abdomen.  The small bowel are not distended.  No colonic distension.  Pelvis:  The appendix is normal.  The prostate gland is not enlarged.  Bladder wall is not thickened.  Calcifications in the pelvis consistent with phleboliths.  No free or loculated pelvic fluid collections.  Mild degenerative changes in the lumbar spine with normal alignment.  IMPRESSION: Circumferential wall thickening of the distal esophagus with mild dilatation.  Consider esophagitis although mass lesion is not excluded.  Peristalsis versus inflammatory thickening of the pyloric region of the stomach.  Consider endoscopy for further evaluation of these changes.  No evidence of bowel obstruction.  No acute process otherwise demonstrated in the abdomen or pelvis.  Original Report Authenticated By: Marlon Pel, M.D.      Assessment Present on Admission:   .Hematemesis  .Hyponatremia Tachycardia -- both probably secondary to vomiting and dehydration  Abnormal UGI on CT  .  HTN (hypertension), benign .PUD (peptic ulcer disease) .GERD (gastroesophageal reflux disease)  .Lung nodules per abdominal CT, 10/27/11   PLAN: We will admit this gentleman on on observation for hydration and stabilization of his vitals;  Consult GI for possible early upper endoscopy, although he has had no witnessed vomiting, no melena, and no elevated BUN.  High-dose  Protonix, and adequate pain control.  Once his pain is controlled, will pay attention to blood pressure management.  Counseled on the importance of maintaining his blood pressure well controlled. Counsel on alcohol abstinence in the presence of GI problems  Other plans as per orders.    Chealsey Miyamoto 10/27/2011, 6:00 AM

## 2011-10-27 NOTE — Op Note (Signed)
[facility] [address] [place], [place]  [address]  ENDOSCOPY PROCEDURE REPORT  PATIENT:  Timothy, Novak  MR#:  [id number] BIRTHDATE:  [birth year], 41 yrs. old  GENDER:  male  ENDOSCOPIST:  Jonette Eva, MD Referred by:  DR. Karilyn Cota  PROCEDURE DATE:  10/27/2011 PROCEDURE:  EGD with biopsy, 19147 ASA CLASS: INDICATIONS:  HEMATEMESIS  GERD FOR 14 YEARS-NO PPI FOR 12 YEARS  MEDICATIONS:   Demerol 150 mg IV, Versed 8 mg IV, promethazine (Phenergan) 25 mg IV TOPICAL ANESTHETIC:  Cetacaine Spray  DESCRIPTION OF PROCEDURE:     Physical exam was performed. Informed consent was obtained from the patient after explaining the benefits, risks, and alternatives to the procedure.  The patient was connected to the monitor and placed in the left lateral position.  Continuous oxygen was provided by nasal cannula and IV medicine administered through an indwelling cannula.  After administration of sedation, the patient's esophagus was intubated and the [id number] ([id number]) endoscope was advanced under direct visualization to the second portion of the duodenum.  The scope was removed slowly by carefully examining the color, texture, anatomy, and integrity of the mucosa on the way out.  The patient was recovered in endoscopy and discharged home in satisfactory condition. <<PROCEDUREIMAGES>>  EROSIVE Esophagitis was found in the distal esophagus BEGINNING 30 CM FROM TEH TEETH AND EXTENDING TO GE JUNCTION-40 CM FROM THE TEETH.  NODULAR APPPEARANCE A THE GE JXN. BIOPSES OBTAINED VIA COLD FORCEPS. A 2-3 CM hiatal hernia was found. OTHERWISE NL STOMACH AND DUODENUM.  COMPLICATIONS:    None  ENDOSCOPIC IMPRESSION: 1) SEVERE DISTAL Esophagitis DUE TO GERD 2) SMALL Hiatal hernia  RECOMMENDATIONS: SOFT MECH LOW FAT DIET AWAIT BIOPSIES BID PPI FOR 3 MOS THEN DAILY. OPV IN 3 MOS WITH DR. Charvez Voorhies.  REPEAT EXAM:  No  ______________________________ Jonette Eva,  MD  CC:  n. eSIGNED:   Nyle Novak at 10/27/2011 03:20 PM  Timothy Novak, [phone]

## 2011-10-27 NOTE — ED Provider Notes (Signed)
History     CSN: [id number]  Arrival date & time 10/26/11  2348   First MD Initiated Contact with Patient 10/27/11 0004      Chief Complaint  Patient presents with  . Rectal Bleeding    Patient is a 41 y.o. male presenting with abdominal pain. The history is provided by the patient.  Abdominal Pain The primary symptoms of the illness include abdominal pain, fatigue, nausea, vomiting, hematemesis and hematochezia. The primary symptoms of the illness do not include fever. The current episode started yesterday. The onset of the illness was gradual. The problem has been gradually worsening.  Associated with: h/o ulcer. Symptoms associated with the illness do not include chills.  Pt reports that he started to have abdominal pain over two days ago, and then today his pain worsened, he has vomited and reports coffee ground emesis.  Also reports blood in stool.   He denies ETOH abuse No cp/sob reported He reports fatigue/dizziness  Past Medical History  Diagnosis Date  . Stomach ulcer   . GERD (gastroesophageal reflux disease)   . Hypertension     Past Surgical History  Procedure Date  . Hernia repair     No family history on file.  History  Substance Use Topics  . Smoking status: Never Smoker   . Smokeless tobacco: Not on file  . Alcohol Use: Yes     occ      Review of Systems  Constitutional: Positive for fatigue. Negative for fever and chills.  Gastrointestinal: Positive for nausea, vomiting, abdominal pain, hematochezia and hematemesis.  All other systems reviewed and are negative.    Allergies  Review of patient's allergies indicates no known allergies.  Home Medications   Current Outpatient Rx  Name Route Sig Dispense Refill  . DIPHENHYDRAMINE-APAP (SLEEP) 25-500 MG PO TABS Oral Take 2 tablets by mouth at bedtime as needed. For sleep/pain       BP 97/61  Pulse 135  Temp 98.4 F (36.9 C)  Resp 20  Ht 6\' 1"  (1.854 m)  Wt 215 lb (97.523 kg)  BMI 28.37  kg/m2  SpO2 97% BP 162/98  Pulse 110  Temp(Src) 99.2 F (37.3 C) (Oral)  Resp 16  Ht 6\' 1"  (1.854 m)  Wt 215 lb (97.523 kg)  BMI 28.37 kg/m2  SpO2 96%   Physical Exam CONSTITUTIONAL: Well developed/well nourished HEAD AND FACE: Normocephalic/atraumatic EYES: EOMI/PERRL ENMT: Mucous membranes dry NECK: supple no meningeal signs SPINE:entire spine nontender CV: S1/S2 noted, no murmurs/rubs/gallops noted LUNGS: Lungs are clear to auscultation bilaterally, no apparent distress ABDOMEN: soft, diffuse tenderness noted, tenderness is mild, no rebound or guarding GU:no cva tenderness Rectal - stool color normal, hem negative, chaperone present NEURO: Pt is awake/alert, moves all extremitiesx4 EXTREMITIES: pulses normal, full ROM SKIN: warm, color normal PSYCH: no abnormalities of mood noted  ED Course  Procedures   Labs Reviewed  CBC - Abnormal; Notable for the following:    WBC 14.7 (*)    All other components within normal limits  DIFFERENTIAL - Abnormal; Notable for the following:    Neutrophils Relative 90 (*)    Neutro Abs 13.3 (*)    Lymphocytes Relative 7 (*)    All other components within normal limits  BASIC METABOLIC PANEL - Abnormal; Notable for the following:    Sodium 129 (*)    Chloride 89 (*)    Glucose, Bld 102 (*)    All other components within normal limits  HEPATIC FUNCTION PANEL  LIPASE, BLOOD   1:48 AM Pt significantly orthostatic His abdomen is diffusely tender and worsened after observation Will obtain CT imaging His initial HGB normal and no signs of GI bleed by exam 4:40 AM Pt still with abdominal pain CT results noted Given dehydration, continued pain and had vomiting, would benefit from admission Pt agreeable   D/w dr Orvan Falconer to admit for observation, monitoring, hydration Also aware of likely need for endoscopy and also possible pulmonary nodules that will need followup as outpatient    MDM  Nursing notes reviewed and considered  in documentation All labs/vitals reviewed and considered Previous records reviewed and considered      Date: 10/27/2011  Rate: 107  Rhythm: sinus tachycardia  QRS Axis: normal  Intervals: normal  ST/T Wave abnormalities: nonspecific ST changes  Conduction Disutrbances:none  Narrative Interpretation:   Old EKG Reviewed: changes noted RBBB noted previously     Joya Gaskins, MD 10/27/11 [phone]

## 2011-10-27 NOTE — Progress Notes (Signed)
Subjective: This man was admitted with it appears quite severe epigastric abdominal pain with radiation up his chest. He says that this pain is exacerbated by spicy foods and things like tomato sauce. The history appears to be one of reflux. There is no history of hematemesis. CT scan of his abdomen shows distal esophageal wall thickening with mild dilatation. This likely represents esophagitis but a mass lesion cannot be excluded. The patient denies any significant weight loss.           Physical Exam: Blood pressure 151/97, pulse 101, temperature 97.6 F (36.4 C), temperature source Oral, resp. rate 20, height 6\' 1"  (1.854 m), weight 97.523 kg (215 lb), SpO2 98.00%. He looks systemically well. He does not look pale. Heart sounds are present and normal. Lung fields are clear. Abdomen is soft and slightly tender in the epigastric area. He is alert and orientated without any focal signs.   Investigations:     Basic Metabolic Panel:  Basename 10/27/11 0016  NA 129*  K 4.2  CL 89*  CO2 26  GLUCOSE 102*  BUN 7  CREATININE 0.69  CALCIUM 9.8  MG --  PHOS --   Liver Function Tests:  [facility] 10/27/11 0024  AST 15  ALT 12  ALKPHOS 63  BILITOT 0.5  PROT 7.3  ALBUMIN 3.6     CBC:  Basename 10/27/11 0437 10/27/11 0016  WBC 15.5* 14.7*  NEUTROABS -- 13.3*  HGB 13.6 14.9  HCT 40.9 43.8  MCV 89.9 89.4  PLT 298 346    Ct Abdomen Pelvis W Contrast  10/27/2011  *RADIOLOGY REPORT*  Clinical Data: Upper abdominal pain.  Dark black stools.  History of stomach ulcer.  CT ABDOMEN AND PELVIS WITH CONTRAST  Technique:  Multidetector CT imaging of the abdomen and pelvis was performed following the standard protocol during bolus administration of intravenous contrast.  Contrast: OMNIPAQUE IOHEXOL 300 MG/ML  SOLN  Comparison: None.  Findings: Atelectasis or early infiltration in the right lung base. There are a few scattered pulmonary nodules in the right lung measuring up to  about 4 mm diameter. If the patient is at high risk for bronchogenic carcinoma, follow-up chest CT at 1 year is recommended.  If the patient is at low risk, no follow-up is needed.  This recommendation follows the consensus statement: Guidelines for Management of Small Pulmonary Nodules Detected on CT Scans:  A Statement from the Fleischner Society as published in Radiology 2005; 237:395-400.  The distal esophagus demonstrates diffuse wall thickening and mild dilatation.  Changes suggest inflammatory process such as reflux esophagitis.  Distal esophageal mass is not excluded.  There are a few small lymph nodes adjacent to the esophagus.  Consider endoscopy for further evaluation.  The stomach is mildly distended.  Mild thickening of the pyloric region of the stomach might represent gastritis, ulcer disease, or normal nondistension due to peristalsis.  The liver, spleen, gallbladder, pancreas, adrenal glands, abdominal aorta, and retroperitoneal lymph nodes are unremarkable.  Bilateral renal parenchymal cysts.  No solid mass or hydronephrosis in the kidneys.  No free air or free fluid in the abdomen.  The small bowel are not distended.  No colonic distension.  Pelvis:  The appendix is normal.  The prostate gland is not enlarged.  Bladder wall is not thickened.  Calcifications in the pelvis consistent with phleboliths.  No free or loculated pelvic fluid collections.  Mild degenerative changes in the lumbar spine with normal alignment.  IMPRESSION: Circumferential wall thickening of the  distal esophagus with mild dilatation.  Consider esophagitis although mass lesion is not excluded.  Peristalsis versus inflammatory thickening of the pyloric region of the stomach.  Consider endoscopy for further evaluation of these changes.  No evidence of bowel obstruction.  No acute process otherwise demonstrated in the abdomen or pelvis.  Original Report Authenticated By: Marlon Pel, M.D.      Medications:  Scheduled:     .  HYDROmorphone (DILAUDID) injection  1 mg Intravenous Once  .  HYDROmorphone (DILAUDID) injection  1 mg Intravenous Once  .  morphine injection  4 mg Intravenous Once  . pantoprazole (PROTONIX) IV  40 mg Intravenous Once  . pantoprazole (PROTONIX) IV  40 mg Intravenous Q12H  . pantoprazole (PROTONIX) IV  40 mg Intravenous Once  . sodium chloride  1,000 mL Intravenous Once  . sodium chloride  1,000 mL Intravenous Once  . sodium chloride  1,000 mL Intravenous Once    Impression: 1. Epigastric pain, likely peptic ulcer disease versus GERD and esophagitis. Need to make sure there is no other pathology. 2. Hypertension, noncompliant with medications. 3. Hyponatremia, likely secondary to dehydration.     Plan: 1. Continue with intravenous Protonix. 2. GI consultation with a view to EGD.     LOS: 1 day   Wilson Singer Pager [phone]  10/27/2011, 10:03 AM

## 2011-10-27 NOTE — Clinical Social Work Psychosocial (Addendum)
    Clinical Social Work Department BRIEF PSYCHOSOCIAL ASSESSMENT 10/27/2011  Patient:  Timothy Novak, Timothy Novak     Account Number:  [id number]     Admit date:  10/26/2011  Clinical Social Worker:  Santa Genera, CLINICAL SOCIAL WORKER  Date/Time:  10/27/2011 04:30 PM  Referred by:  RN  Date Referred:  10/27/2011 Referred for  Other - See comment   Other Referral:   Referred for help with medications.   Interview type:  Patient Other interview type:    PSYCHOSOCIAL DATA Living Status:  ALONE Admitted from facility:   Level of care:   Primary support name:  None Primary support relationship to patient:   Degree of support available:   Patient states he has no family or friends in the area who can assist him at discharge.  Started working at [employer] 8 months ago, will get insurance 10-29-11. Lives alone at [facility].  Wants referral for medication assistance.    CURRENT CONCERNS Current Concerns  Financial Resources   Other Concerns:    SOCIAL WORK ASSESSMENT / PLAN Talked with patient about resources available to receive free or reduced cost medications. Spoke w RN CM who will address this with patient tomorrow.  Patient would also like referral to [facility] for additional support.   Assessment/plan status:  Referral to [facility] Other assessment/ plan:   Information/referral to community resources:   RN CM for medications Development worker, community Program    PATIENT'S/FAMILY'S RESPONSE TO PLAN OF CARE: Patient appreciative.   Clovis Cao Clinical Social Worker [phone])

## 2011-10-27 NOTE — H&P (Signed)
  Primary Care Physician:  No primary provider on file. Primary Gastroenterologist:  Dr. Darrick Penna  Pre-Procedure History & Physical: HPI:  Timothy Novak is a 41 y.o. male here for  HEMATEMESIS.     Past Medical History  Diagnosis Date  . Stomach ulcer age 52  . GERD (gastroesophageal reflux disease) age 30  . Hypertension     Past Surgical History  Procedure Date  . Hernia repair     Prior to Admission medications   Medication Sig Start Date End Date Taking? Authorizing Provider  diphenhydramine-acetaminophen (TYLENOL PM) 25-500 MG TABS Take 2 tablets by mouth at bedtime as needed. For sleep/pain    Yes Historical Provider, MD    Allergies as of 10/26/2011  . (No Known Allergies)    Family History  Problem Relation Age of Onset  . Dementia Mother   . Heart failure Mother   . Hypertension Mother   . Diabetes Mother   . Cancer Father 60    lung  . Hypertension Father   . Cancer Sister 48    ob/gyn  . Hypertension Sister     History   Social History  . Marital Status: Divorced    Spouse Name: N/A    Number of Children: N/A  . Years of Education: N/A   Occupational History  . floor cutter    Social History Main Topics  . Smoking status: Never Smoker   . Smokeless tobacco: Not on file  . Alcohol Use: 2.5 oz/week    5 drink(s) per week     occ  . Drug Use: No  . Sexually Active: Yes   Other Topics Concern  . Not on file   Social History Narrative  . No narrative on file    Review of Systems: See HPI, otherwise negative ROS   Physical Exam: BP 138/98  Pulse 134  Temp(Src) 99.5 F (37.5 C) (Oral)  Resp 13  Ht 6\' 1"  (1.854 m)  Wt 215 lb (97.523 kg)  BMI 28.37 kg/m2  SpO2 93% General:   Alert,  pleasant and cooperative in NAD Head:  Normocephalic and atraumatic. Neck:  Supple;  Lungs:  Clear throughout to auscultation.    Heart:  Regular rate and rhythm. Abdomen:  Soft, nontender and nondistended. Normal bowel sounds, without guarding,  and without rebound.   Neurologic:  Alert and  oriented x4;  grossly normal neurologically.  Impression/Plan:     HEMATEMESIS  PLAN:  EGD TODAY

## 2011-10-28 ENCOUNTER — Encounter (HOSPITAL_COMMUNITY): Payer: Self-pay | Admitting: Internal Medicine

## 2011-10-28 DIAGNOSIS — K920 Hematemesis: Secondary | ICD-10-CM

## 2011-10-28 DIAGNOSIS — I10 Essential (primary) hypertension: Secondary | ICD-10-CM

## 2011-10-28 DIAGNOSIS — E871 Hypo-osmolality and hyponatremia: Secondary | ICD-10-CM

## 2011-10-28 DIAGNOSIS — R079 Chest pain, unspecified: Secondary | ICD-10-CM

## 2011-10-28 DIAGNOSIS — E860 Dehydration: Secondary | ICD-10-CM

## 2011-10-28 DIAGNOSIS — K2100 Gastro-esophageal reflux disease with esophagitis, without bleeding: Secondary | ICD-10-CM

## 2011-10-28 LAB — BASIC METABOLIC PANEL
BUN: 9 mg/dL (ref 6–23)
CO2: 30 mEq/L (ref 19–32)
Calcium: 9.4 mg/dL (ref 8.4–10.5)
Chloride: 98 mEq/L (ref 96–112)
Creatinine, Ser: 0.73 mg/dL (ref 0.50–1.35)
GFR calc Af Amer: >90 mL/min (ref >90)
GFR calc non Af Amer: >90 mL/min (ref >90)
Glucose, Bld: 97 mg/dL (ref 70–99)
Potassium: 4 mEq/L (ref 3.5–5.1)
Sodium: 136 mEq/L (ref 135–145)

## 2011-10-28 LAB — CBC
HCT: 43.2 % (ref 39.0–52.0)
Hemoglobin: 14.3 g/dL (ref 13.0–17.0)
MCH: 30.6 pg (ref 26.0–34.0)
MCHC: 33.1 g/dL (ref 30.0–36.0)
MCV: 92.3 fL (ref 78.0–100.0)
Platelets: 318 10*3/uL (ref 150–400)
RBC: 4.68 MIL/uL (ref 4.22–5.81)
RDW: 13.4 % (ref 11.5–15.5)
WBC: 14.7 10*3/uL — ABNORMAL HIGH (ref 4.0–10.5)

## 2011-10-28 MED ORDER — SUCRALFATE 1 G PO TABS: 1.0000 g | ORAL_TABLET | Freq: Three times a day (TID) | ORAL | Status: DC

## 2011-10-28 MED ORDER — HYDROCODONE-ACETAMINOPHEN 5-500 MG PO TABS: 1.0000 | ORAL_TABLET | Freq: Four times a day (QID) | ORAL | Status: AC | PRN

## 2011-10-28 MED ORDER — PANTOPRAZOLE SODIUM 40 MG PO TBEC: 40.0000 mg | DELAYED_RELEASE_TABLET | Freq: Two times a day (BID) | ORAL | Status: DC

## 2011-10-28 MED ORDER — SUCRALFATE 1 G PO TABS
1.0000 g | ORAL_TABLET | Freq: Three times a day (TID) | ORAL | Status: DC
  Administered 2011-10-28: 1 g via ORAL
  Filled 2011-10-28: qty 1

## 2011-10-28 MED ORDER — PANTOPRAZOLE SODIUM 40 MG PO TBEC: 40.0000 mg | DELAYED_RELEASE_TABLET | Freq: Two times a day (BID) | ORAL | Status: AC

## 2011-10-28 MED ORDER — SODIUM CHLORIDE 0.9 % IJ SOLN
INTRAMUSCULAR | Status: AC
  Administered 2011-10-28: 10 mL
  Filled 2011-10-28: qty 3

## 2011-10-28 NOTE — Plan of Care (Signed)
Problem: Phase III Progression Outcomes Goal: Other Phase III Outcomes/Goals Outcome: Completed/Met Date Met:  10/28/11 Pt to receive help to obtain meds

## 2011-10-28 NOTE — Discharge Instructions (Signed)
Esophagitis  Esophagitis is inflammation of the esophagus. It can involve swelling, soreness, and pain in the esophagus. This condition can make it difficult and painful to swallow.  CAUSES   Most causes of esophagitis are not serious. Many different factors can cause esophagitis, including:   Gastroesophageal reflux disease (GERD). This is when acid from your stomach flows up into the esophagus.   Recurrent vomiting.   An allergic-type reaction.   Certain medicines, especially those that come in large pills.   Ingestion of harmful chemicals, such as household cleaning products.   Heavy alcohol use.   An infection of the esophagus.   Radiation treatment for cancer.   Certain diseases such as sarcoidosis, Crohn's disease, and scleroderma. These diseases may cause recurrent esophagitis.  SYMPTOMS    Trouble swallowing.   Painful swallowing.   Chest pain.   Difficulty breathing.   Nausea.   Vomiting.   Abdominal pain.  DIAGNOSIS   Your caregiver will take your history and do a physical exam. Depending upon what your caregiver finds, certain tests may also be done, including:   Barium X-ray. You will drink a solution that coats the esophagus, and X-rays will be taken.   Endoscopy. A lighted tube is put down the esophagus so your caregiver can examine the area.   Allergy tests. These can sometimes be arranged through follow-up visits.  TREATMENT   Treatment will depend on the cause of your esophagitis. In some cases, steroids or other medicines may be given to help relieve your symptoms or to treat the underlying cause of your condition. Medicines that may be recommended include:   Viscous lidocaine, to soothe the esophagus.   Antacids.   Acid reducers.   Proton pump inhibitors.   Antiviral medicines for certain viral infections of the esophagus.   Antifungal medicines for certain fungal infections of the esophagus.   Antibiotic medicines, depending on the cause of the esophagitis.  HOME CARE  INSTRUCTIONS    Avoid foods and drinks that seem to make your symptoms worse.   Eat small, frequent meals instead of large meals.   Avoid eating for the 3 hours prior to your bedtime.   If you have trouble taking pills, use a pill splitter to decrease the size and likelihood of the pill getting stuck or injuring the esophagus on the way down. Drinking water after taking a pill also helps.   Stop smoking if you smoke.   Maintain a healthy weight.   Wear loose-fitting clothing. Do not wear anything tight around your waist that causes pressure on your stomach.   Raise the head of your bed 6 to 8 inches with wood blocks to help you sleep. Extra pillows will not help.   Only take over-the-counter or prescription medicines as directed by your caregiver.  SEEK IMMEDIATE MEDICAL CARE IF:   You have severe chest pain that radiates into your arm, neck, or jaw.   You feel sweaty, dizzy, or lightheaded.   You have shortness of breath.   You vomit blood.   You have difficulty or pain with swallowing.   You have bloody or black, tarry stools.   You have a fever.   You have a burning sensation in the chest more than 3 times a week for more than 2 weeks.   You cannot swallow, drink, or eat.   You drool because you cannot swallow your saliva.  MAKE SURE YOU:   Understand these instructions.   Will watch your condition.     Will get help right away if you are not doing well or get worse.  Document Released: 06/23/2004 Document Revised: 05/05/2011 Document Reviewed: 01/14/2011  ExitCare Patient Information 2012 [employer].

## 2011-10-28 NOTE — Plan of Care (Signed)
Problem: Discharge Progression Outcomes Goal: Other Discharge Outcomes/Goals Outcome: Completed/Met Date Met:  10/28/11 Discharge instructions read to pt and his friend  They verbalized understanding of all instructions  Pt agrees to call Dr Darrick Penna office for appt on Monday  Discharge to home with friend

## 2011-10-28 NOTE — Discharge Summary (Signed)
Physician Discharge Summary  Patient ID: [name] MRN: [id number] DOB/AGE: 27-Sep-1970 41 y.o.  Admit date: 10/26/2011 Discharge date: 10/28/2011  Primary Care Physician:  No primary provider on file.   Discharge Diagnoses:    Active Problems: Severe Esophagitis  HTN (hypertension), benign  PUD (peptic ulcer disease)  GERD (gastroesophageal reflux disease)  Hematemesis  Hyponatremia, due to dehydration, resolved  Lung nodules per abdominal CT, 10/27/11, patient is a non smoker    Medication List  As of 10/28/2011 11:32 AM   STOP taking these medications         diphenhydramine-acetaminophen 25-500 MG Tabs         TAKE these medications         HYDROcodone-acetaminophen 5-500 MG per tablet   Commonly known as: VICODIN   Take 1 tablet by mouth every 6 (six) hours as needed for pain.      pantoprazole 40 MG tablet   Commonly known as: PROTONIX   Take 1 tablet (40 mg total) by mouth 2 (two) times daily.      sucralfate 1 G tablet   Commonly known as: CARAFATE   Take 1 tablet (1 g total) by mouth 4 (four) times daily -  with meals and at bedtime.           Discharge Exam: Blood pressure 133/78, pulse 109, temperature 98.4 F (36.9 C), temperature source Oral, resp. rate 18, height 6\' 1"  (1.854 m), weight 97.523 kg (215 lb), SpO2 99.00%. NAD CTA B S1, S2, RRR Soft, NT,BS+ No edema b/l  Disposition and Follow-up:  Follow up with Dr. Darrick Penna in 3 months  Consults:  Dr. Darrick Penna, GI   Significant Diagnostic Studies:  Ct Abdomen Pelvis W Contrast  10/27/2011  *RADIOLOGY REPORT*  Clinical Data: Upper abdominal pain.  Dark black stools.  History of stomach ulcer.  CT ABDOMEN AND PELVIS WITH CONTRAST  Technique:  Multidetector CT imaging of the abdomen and pelvis was performed following the standard protocol during bolus administration of intravenous contrast.  Contrast: OMNIPAQUE IOHEXOL 300 MG/ML  SOLN  Comparison: None.  Findings: Atelectasis or early  infiltration in the right lung base. There are a few scattered pulmonary nodules in the right lung measuring up to about 4 mm diameter. If the patient is at high risk for bronchogenic carcinoma, follow-up chest CT at 1 year is recommended.  If the patient is at low risk, no follow-up is needed.  This recommendation follows the consensus statement: Guidelines for Management of Small Pulmonary Nodules Detected on CT Scans:  A Statement from the Fleischner Society as published in Radiology 2005; 237:395-400.  The distal esophagus demonstrates diffuse wall thickening and mild dilatation.  Changes suggest inflammatory process such as reflux esophagitis.  Distal esophageal mass is not excluded.  There are a few small lymph nodes adjacent to the esophagus.  Consider endoscopy for further evaluation.  The stomach is mildly distended.  Mild thickening of the pyloric region of the stomach might represent gastritis, ulcer disease, or normal nondistension due to peristalsis.  The liver, spleen, gallbladder, pancreas, adrenal glands, abdominal aorta, and retroperitoneal lymph nodes are unremarkable.  Bilateral renal parenchymal cysts.  No solid mass or hydronephrosis in the kidneys.  No free air or free fluid in the abdomen.  The small bowel are not distended.  No colonic distension.  Pelvis:  The appendix is normal.  The prostate gland is not enlarged.  Bladder wall is not thickened.  Calcifications in the pelvis consistent with  phleboliths.  No free or loculated pelvic fluid collections.  Mild degenerative changes in the lumbar spine with normal alignment.  IMPRESSION: Circumferential wall thickening of the distal esophagus with mild dilatation.  Consider esophagitis although mass lesion is not excluded.  Peristalsis versus inflammatory thickening of the pyloric region of the stomach.  Consider endoscopy for further evaluation of these changes.  No evidence of bowel obstruction.  No acute process otherwise demonstrated in the  abdomen or pelvis.  Original Report Authenticated By: Marlon Pel, M.D.    Brief H and P: For complete details please refer to admission H and P, but in brief Timothy Novak is an 41 y.o. male. 10 year diagnosis of hypertension noncompliant with medical therapy for financial reasons, also history of stomach ulcers and GERD, similarly noncompliant, with consequent episodic stomach pains whenever he eats certain foods. Drinks typically 5 beers per weekend. Last alcohol use, 2 beers Memorial Day.  He has a remote history of heavier alcohol use.  Onset of severe epigastric abdominal pain unrelenting for the past 3 days, associated with vomiting x3 times yesterday. Initially vomitus was pinkish bloodstained fluid, then large amounts of black vomitus x2. Denies bilious material, denies bloody or melena stool. No stool passed the past 2 days. Been checking to the emergency room for evaluation because of his severe abdominal pain. Denies use of Advil, Aleve aspirin or any over-the-counter NSAIDs.  Some of the chronic dizziness with standing which she associates with his untreated hypertension.  Checks his blood pressure at the pharmacy episodically and his systolics range between 180 and 120.  In the emergency room patient has remained persistently tachycardic despite repeated boluses of IV fluid.  Hospital Course:  Patient was admitted for vomiting, hematemesis. CT scan of the abdomen and pelvis showed esophageal thickening. He was seen in consultation by Dr. Darrick Penna from gastroenterology and underwent upper endoscopy. Results revealed severe esophagitis felt to be secondary to gastroesophageal reflux disease. Patient was placed on twice a day Protonix. He's also been prescribed Carafate. He is currently tolerating a by mouth diet without any vomiting. He still does have some chest discomfort. He reports improvement of his discomfort when he drinks milk. He was advised regarding lifestyle changes to  help with acid reflux. He will need to see Dr. Darrick Penna in the outpatient setting in 3 months time for repeat evaluation. He is felt stable to discharge home today.  Time spent on Discharge:  Signed: Alexios Keown [facility] Pager: [phone] 10/28/2011, 11:32 AM

## 2011-10-28 NOTE — Progress Notes (Signed)
REVIEWED. AGREE. 

## 2011-10-28 NOTE — Progress Notes (Signed)
Subjective: C/o chest pain 9/10.  Denies SOB or palpitations,  C/o odynophagia.  Had pancakes & oatmeal & tolerating.  Denies N/V.    Objective: Vital signs in last 24 hours: Temp:  [97.9 F (36.6 C)-99.5 F (37.5 C)] 98.4 F (36.9 C) (05/31 0818) Pulse Rate:  [83-143] 109  (05/31 0818) Resp:  [13-33] 18  (05/31 0818) BP: (127-179)/(68-119) 133/78 mmHg (05/31 0818) SpO2:  [93 %-100 %] 99 % (05/31 0818) Last BM Date: 10/24/11 General:   Alert,  Well-developed, well-nourished, pleasant and cooperative in NAD. Eyes:  Sclera clear, no icterus.   Conjunctiva pink. Mouth:  No deformity or lesions, oropharynx pink & moist. Neck:  Supple; no masses or thyromegaly. Heart:  Regular rate and rhythm; no murmurs, clicks, rubs,  or gallops. Abdomen:  Soft, nontender and nondistended. No masses, hepatosplenomegaly or hernias noted. Normal bowel sounds, without guarding, and without rebound.   Msk:  Symmetrical without gross deformities. Normal posture. Extremities:  Without clubbing or edema. Neurologic:  Alert and  oriented x4;  grossly normal neurologically. Skin:  Intact without significant lesions or rashes. Cervical Nodes:  No significant cervical adenopathy. Psych:  Alert and cooperative. Normal mood and affect.  Intake/Output from previous day: 05/30 0701 - 05/31 0700 In: 840 [P.O.:240; I.V.:600] Out: -   Lab Results:  Basename 10/28/11 0530 10/27/11 0437 10/27/11 0016  WBC 14.7* 15.5* 14.7*  HGB 14.3 13.6 14.9  HCT 43.2 40.9 43.8  PLT 318 298 346   BMET  Basename 10/28/11 0530 10/27/11 0016  NA 136 129*  K 4.0 4.2  CL 98 89*  CO2 30 26  GLUCOSE 97 102*  BUN 9 7  CREATININE 0.73 0.69  CALCIUM 9.4 9.8   LFT  Basename 10/27/11 0024  PROT 7.3  ALBUMIN 3.6  AST 15  ALT 12  ALKPHOS 63  BILITOT 0.5  BILIDIR 0.1  IBILI 0.4  LIPASE 26  AMYLASE --   PT/INR  Basename 10/27/11 0545  LABPROT 12.5  INR 0.91   Studies/Results: Ct Abdomen Pelvis W  Contrast  10/27/2011  *RADIOLOGY REPORT*  Clinical Data: Upper abdominal pain.  Dark black stools.  History of stomach ulcer.  CT ABDOMEN AND PELVIS WITH CONTRAST  Technique:  Multidetector CT imaging of the abdomen and pelvis was performed following the standard protocol during bolus administration of intravenous contrast.  Contrast: OMNIPAQUE IOHEXOL 300 MG/ML  SOLN  Comparison: None.  Findings: Atelectasis or early infiltration in the right lung base. There are a few scattered pulmonary nodules in the right lung measuring up to about 4 mm diameter. If the patient is at high risk for bronchogenic carcinoma, follow-up chest CT at 1 year is recommended.  If the patient is at low risk, no follow-up is needed.  This recommendation follows the consensus statement: Guidelines for Management of Small Pulmonary Nodules Detected on CT Scans:  A Statement from the Fleischner Society as published in Radiology 2005; 237:395-400.  The distal esophagus demonstrates diffuse wall thickening and mild dilatation.  Changes suggest inflammatory process such as reflux esophagitis.  Distal esophageal mass is not excluded.  There are a few small lymph nodes adjacent to the esophagus.  Consider endoscopy for further evaluation.  The stomach is mildly distended.  Mild thickening of the pyloric region of the stomach might represent gastritis, ulcer disease, or normal nondistension due to peristalsis.  The liver, spleen, gallbladder, pancreas, adrenal glands, abdominal aorta, and retroperitoneal lymph nodes are unremarkable.  Bilateral renal parenchymal cysts.  No  solid mass or hydronephrosis in the kidneys.  No free air or free fluid in the abdomen.  The small bowel are not distended.  No colonic distension.  Pelvis:  The appendix is normal.  The prostate gland is not enlarged.  Bladder wall is not thickened.  Calcifications in the pelvis consistent with phleboliths.  No free or loculated pelvic fluid collections.  Mild degenerative  changes in the lumbar spine with normal alignment.  IMPRESSION: Circumferential wall thickening of the distal esophagus with mild dilatation.  Consider esophagitis although mass lesion is not excluded.  Peristalsis versus inflammatory thickening of the pyloric region of the stomach.  Consider endoscopy for further evaluation of these changes.  No evidence of bowel obstruction.  No acute process otherwise demonstrated in the abdomen or pelvis.  Original Report Authenticated By: Marlon Pel, M.D.    Assessment: 1. Erosive reflux esophagitis: on PPI BID 2. Chest pain & odynophagia:  R/T esophagitis  Plan: 1. Continue BID Protonix 2. Add QID Carafate 3. Office visit 3 mo w/ Dr Darrick Penna  LOS: 2 days   Timothy Novak  10/28/2011, 9:37 AM

## 2011-10-31 ENCOUNTER — Telehealth: Payer: Self-pay

## 2011-10-31 NOTE — Care Management Note (Signed)
    Page 1 of 1   10/31/2011     5:01:32 PM   CARE MANAGEMENT NOTE 10/31/2011  Patient:  Timothy Novak,Timothy Novak   Account Number:  [id number]  Date Initiated:  10/28/2011  Documentation initiated by:  Anibal Henderson  Subjective/Objective Assessment:   Pt admitted with GIB. He is from home, but home is a motel at present. He may have lost his job, because no one called in for him the day he was admitted here. Discussed that sometimes hosp admission is an exception to this rule, and he     Action/Plan:   should check with his workplace about this. Referred to B. Ratliff, Artist, and she is working with pt on possible COBRA. Assisted pt with meds through [employer] grant fund. He will get the $4 med   Anticipated DC Date:  10/31/2011   Anticipated DC Plan:  HOME/SELF CARE  In-house referral  Financial Counselor  Clinical Social Worker      DC Planning Services  CM consult      Choice offered to / List presented to:             Status of service:  Completed, signed off Medicare Important Message given?   (If response is "NO", the following Medicare IM given date fields will be blank) Date Medicare IM given:   Date Additional Medicare IM given:    Discharge Disposition:  HOME/SELF CARE  Per UR Regulation:    If discussed at Long Length of Stay Meetings, dates discussed:    Comments:  10/31/11 11700 Delayed entry Anibal Henderson RN

## 2011-10-31 NOTE — Telephone Encounter (Signed)
Pt left Vm that the Hydrocodone that Dr.Fields gave him is not working and he wants something different. He said it was given to him on Friday. I called and told him that she most likely was not going to give him anything stronger. I told him I would send a message to Dr. Darrick Penna.

## 2011-11-01 ENCOUNTER — Telehealth: Payer: Self-pay | Admitting: Gastroenterology

## 2011-11-01 ENCOUNTER — Encounter (HOSPITAL_COMMUNITY): Payer: Self-pay | Admitting: Gastroenterology

## 2011-11-01 NOTE — Telephone Encounter (Signed)
LMOM to call.

## 2011-11-01 NOTE — Telephone Encounter (Signed)
Pt called back and was informed to contact his PCP regarding Rx for hydrocodone

## 2011-11-01 NOTE — Telephone Encounter (Signed)
I DID NOT PRESCRIBE HYDROCODONE.   Please call pt. WE DO NOT USE HYDROCODONE FOR ESOPHAGITIS. He should see his PCP & IF THEY FEEL IT'S NEEDED THEY WILL PRESCRIBE THE MEDS.

## 2011-11-01 NOTE — Telephone Encounter (Signed)
Pt was informed by Crystal. See phone note of 11/01/2011.

## 2011-11-03 ENCOUNTER — Telehealth: Payer: Self-pay | Admitting: Gastroenterology

## 2011-11-03 NOTE — Telephone Encounter (Signed)
PLEASE CALL PT. HIS ESOPHAGEAL BIOPSIES SHOW REFLUX. CONTINUE BID PROTONIX. FOLLOW A LOW FAT DIET. OPV IN 3 MOS, E 30 ESOPHAGITIS.

## 2011-11-03 NOTE — Telephone Encounter (Signed)
LMOM to call back

## 2011-11-03 NOTE — Telephone Encounter (Signed)
NO PCP on file reminder is in the computer

## 2011-11-07 NOTE — Telephone Encounter (Signed)
Called. Many rings and no answer. Will mail a letter to call.

## 2011-12-07 ENCOUNTER — Telehealth: Payer: Self-pay

## 2011-12-07 NOTE — Telephone Encounter (Signed)
Letter was returned that was mailed to [address]. I called [place] [facility] and spoke to [place]. He said they have [address] [place] on file. I will resend letter to that address.

## 2011-12-22 NOTE — Progress Notes (Signed)
Letter was returned that was mailed on 11/07/2011 to [facility]. Tried new address of [address] [place], [place]. Will mail the letter again.

## 2012-01-11 ENCOUNTER — Encounter: Payer: Self-pay | Admitting: Gastroenterology

## 2012-02-14 ENCOUNTER — Encounter: Payer: Self-pay | Admitting: Gastroenterology

## 2012-02-15 ENCOUNTER — Ambulatory Visit: Payer: Self-pay | Admitting: Gastroenterology

## 2012-02-15 ENCOUNTER — Telehealth: Payer: Self-pay | Admitting: Gastroenterology

## 2012-02-15 NOTE — Telephone Encounter (Signed)
Pt was a no show

## 2012-02-15 NOTE — Telephone Encounter (Signed)
REVIEWED.  

## 2013-07-03 ENCOUNTER — Emergency Department: Payer: Self-pay | Admitting: Emergency Medicine

## 2013-07-03 LAB — CBC WITH DIFFERENTIAL/PLATELET
Basophil #: 0 10*3/uL (ref 0.0–0.1)
Basophil %: 0.3 %
Eosinophil #: 0 10*3/uL (ref 0.0–0.7)
Eosinophil %: 0.6 %
HCT: 47.5 % (ref 40.0–52.0)
HGB: 16.1 g/dL (ref 13.0–18.0)
Lymphocyte #: 0.8 10*3/uL — ABNORMAL LOW (ref 1.0–3.6)
Lymphocyte %: 10.6 %
MCH: 30.1 pg (ref 26.0–34.0)
MCHC: 34 g/dL (ref 32.0–36.0)
MCV: 89 fL (ref 80–100)
Monocyte #: 0.4 x10 3/mm (ref 0.2–1.0)
Monocyte %: 5.6 %
Neutrophil #: 6.4 10*3/uL (ref 1.4–6.5)
Neutrophil %: 82.9 %
Platelet: 278 10*3/uL (ref 150–440)
RBC: 5.36 10*6/uL (ref 4.40–5.90)
RDW: 13.4 % (ref 11.5–14.5)
WBC: 7.7 10*3/uL (ref 3.8–10.6)

## 2013-07-03 LAB — BASIC METABOLIC PANEL
Anion Gap: 7 (ref 7–16)
BUN: 7 mg/dL (ref 7–18)
Calcium, Total: 10 mg/dL (ref 8.5–10.1)
Chloride: 101 mmol/L (ref 98–107)
Co2: 31 mmol/L (ref 21–32)
Creatinine: 0.82 mg/dL (ref 0.60–1.30)
EGFR (African American): >60
EGFR (Non-African Amer.): >60
Glucose: 100 mg/dL — ABNORMAL HIGH (ref 65–99)
Osmolality: 276 (ref 275–301)
Potassium: 3.9 mmol/L (ref 3.5–5.1)
Sodium: 139 mmol/L (ref 136–145)

## 2017-03-27 ENCOUNTER — Emergency Department: Payer: BLUE CROSS/BLUE SHIELD

## 2017-03-27 ENCOUNTER — Emergency Department
Admission: EM | Admit: 2017-03-27 | Discharge: 2017-03-27 | Disposition: A | Discharge status: Home / Self Care | Payer: BLUE CROSS/BLUE SHIELD | Attending: Emergency Medicine | Admitting: Emergency Medicine

## 2017-03-27 DIAGNOSIS — I10 Essential (primary) hypertension: Secondary | ICD-10-CM | POA: Insufficient documentation

## 2017-03-27 DIAGNOSIS — K219 Gastro-esophageal reflux disease without esophagitis: Secondary | ICD-10-CM | POA: Diagnosis not present

## 2017-03-27 DIAGNOSIS — R1013 Epigastric pain: Secondary | ICD-10-CM | POA: Diagnosis present

## 2017-03-27 DIAGNOSIS — Z79899 Other long term (current) drug therapy: Secondary | ICD-10-CM | POA: Insufficient documentation

## 2017-03-27 LAB — COMPREHENSIVE METABOLIC PANEL
ALT: 22 U/L (ref 17–63)
AST: 23 U/L (ref 15–41)
Albumin: 3.7 g/dL (ref 3.5–5.0)
Alkaline Phosphatase: 64 U/L (ref 38–126)
Anion gap: 6 (ref 5–15)
BUN: 12 mg/dL (ref 6–20)
CO2: 28 mmol/L (ref 22–32)
Calcium: 8.9 mg/dL (ref 8.9–10.3)
Chloride: 103 mmol/L (ref 101–111)
Creatinine, Ser: 0.74 mg/dL (ref 0.61–1.24)
GFR calc Af Amer: >60 mL/min (ref >60)
GFR calc non Af Amer: >60 mL/min (ref >60)
Glucose, Bld: 119 mg/dL — ABNORMAL HIGH (ref 65–99)
Potassium: 4.2 mmol/L (ref 3.5–5.1)
Sodium: 137 mmol/L (ref 135–145)
Total Bilirubin: 0.4 mg/dL (ref 0.3–1.2)
Total Protein: 7.3 g/dL (ref 6.5–8.1)

## 2017-03-27 LAB — CBC
HCT: 42.3 % (ref 40.0–52.0)
Hemoglobin: 14 g/dL (ref 13.0–18.0)
MCH: 28.1 pg (ref 26.0–34.0)
MCHC: 33.2 g/dL (ref 32.0–36.0)
MCV: 84.8 fL (ref 80.0–100.0)
Platelets: 330 10*3/uL (ref 150–440)
RBC: 4.99 MIL/uL (ref 4.40–5.90)
RDW: 13.9 % (ref 11.5–14.5)
WBC: 10.4 10*3/uL (ref 3.8–10.6)

## 2017-03-27 LAB — URINALYSIS, COMPLETE (UACMP) WITH MICROSCOPIC
Bacteria, UA: NONE SEEN
Bilirubin Urine: NEGATIVE
Glucose, UA: NEGATIVE mg/dL
Hgb urine dipstick: NEGATIVE
Ketones, ur: NEGATIVE mg/dL
Leukocytes, UA: NEGATIVE
Nitrite: NEGATIVE
Protein, ur: NEGATIVE mg/dL
RBC / HPF: NONE SEEN RBC/hpf (ref 0–5)
Specific Gravity, Urine: 1.017 (ref 1.005–1.030)
pH: 5 (ref 5.0–8.0)

## 2017-03-27 LAB — LIPASE, BLOOD: Lipase: 31 U/L (ref 11–51)

## 2017-03-27 LAB — TROPONIN I: Troponin I: <0.03 ng/mL (ref <0.03)

## 2017-03-27 MED ORDER — SUCRALFATE 1 G PO TABS: 1.0000 g | ORAL_TABLET | Freq: Three times a day (TID) | ORAL | 0 refills | Status: AC

## 2017-03-27 MED ORDER — PANTOPRAZOLE SODIUM 40 MG PO TBEC
40.0000 mg | DELAYED_RELEASE_TABLET | Freq: Once | ORAL | Status: AC
  Administered 2017-03-27: 40 mg via ORAL
  Filled 2017-03-27: qty 1

## 2017-03-27 MED ORDER — GI COCKTAIL ~~LOC~~
30.0000 mL | Freq: Once | ORAL | Status: AC
  Administered 2017-03-27: 30 mL via ORAL
  Filled 2017-03-27: qty 30

## 2017-03-27 MED ORDER — SUCRALFATE 1 G PO TABS
1.0000 g | ORAL_TABLET | Freq: Once | ORAL | Status: AC
  Administered 2017-03-27: 1 g via ORAL
  Filled 2017-03-27: qty 1

## 2017-03-27 NOTE — Discharge Instructions (Signed)
You were evaluated for upper abdominal burning and your exam and evaluation in the emergency department are reassuring.  I suspect gastroesophageal reflux disease.  Please avoid caffeine, soda, alcohol, juices (acidic foods), until healed approx 2-4 weeks.   Take over the counter Prilosec 40mg  twice daily for 14 days. Avoid ibuprofen, naprosyn, aleve (NSAIDS) as these can worsen acid problems. Take over the counter Maalox or Tums as needed to neutralize acid and pain with symptoms.  Return to the emergency department immediately for any worsening or uncontrolled pain, black or bloody stools, vomiting blood, fever, shortness of breath or trouble breathing, dizziness passing out, or any other symptoms concerning to you.  We discussed your left shoulder pain seems to be likely due to the left trapezius muscle spasm.  We discussed massage, heat, and avoiding the repetitive lifting to help resolve this.  Please follow-up with primary care doctor within 1-2 weeks.  Review of you past visits shows your CT scan from 2013 showed lung nodules, and initially had recommended possible repeat chest CT within 1 year depending on risk factors such as smoking, please follow-up again about this with a primary care doctor.

## 2017-03-27 NOTE — ED Triage Notes (Signed)
Patient reports "chest pain" for several weeks, especially after eating.  Reports pain starts at upper chest and goes straight down into the pit of his stomach."  Patient also reports having pain in left shoulder for approximately 1 month.

## 2017-03-27 NOTE — ED Notes (Signed)
Patient ambulatory to lobby with steady gait noted and no apparent distress. Verbalized understanding of discharge instructions, follow up care and prescriptions.

## 2017-03-27 NOTE — ED Provider Notes (Signed)
[facility] Emergency Department Provider Note ____________________________________________   I have reviewed the triage vital signs and the triage nursing note.  HISTORY  Chief Complaint Abdominal Pain   Historian Patient  HPI Timothy Novak is a 46 y.o. male with history of esophagitis/gerd/pud per chart review and 2013 hospitalization with endoscopy showing severe esophagitis, presents with epigastric burning for about 2 weeks.  Had not had issues for several years (2013) when noted seen gastroenterologist.  No pcp or gi recently.  No black or bloody stools.  No vomiting.  No trouble breathing or palpitations.  Pain worse after eating, burning in quality and goes from epigastrium sometimes into chest centrally.  Worse at night, and upon waking.  Occasionally hoarse voice or taste of acid in nose/mouth.  Pain is currently moderate, at times severe.  Also reports left top of shoulder hurts with raising above level.  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  Sometimes pain hurts into neck.     Past Medical History:  Diagnosis Date  . Esophagitis   . GERD (gastroesophageal reflux disease) age 46  . Hypertension   . Stomach ulcer age 46    Patient Active Problem List   Diagnosis Date Noted  . HTN (hypertension), benign 10/27/2011  . PUD (peptic ulcer disease) 10/27/2011  . GERD (gastroesophageal reflux disease) 10/27/2011  . Hematemesis 10/27/2011  . Hyponatremia 10/27/2011  . Lung nodules per abdominal CT, 10/27/11 10/27/2011    Past Surgical History:  Procedure Laterality Date  . ESOPHAGOGASTRODUODENOSCOPY  10/27/2011   SEVERE DISTAL Esophagitis DUE TO GERD/ SMALL Hiatal hernia  . HERNIA REPAIR      Prior to Admission medications   Medication Sig Start Date End Date Taking? Authorizing Provider  pantoprazole (PROTONIX) 40 MG tablet Take 1 tablet (40 mg total) by mouth 2 (two) times daily. 10/28/11 10/27/12  Erick BlinksMemon, Jehanzeb, MD   sucralfate (CARAFATE) 1 g tablet Take 1 tablet (1 g total) by mouth 4 (four) times daily -  with meals and at bedtime. 03/27/17 03/27/18  Governor RooksLord, Annalyse Langlais, MD    No Known Allergies  Family History  Problem Relation Age of Onset  . Dementia Mother   . Heart failure Mother   . Hypertension Mother   . Diabetes Mother   . Cancer Father [id number]       lung  . Hypertension Father   . Cancer Sister [id number]       ob/gyn  . Hypertension Sister     Social History Social History  Substance Use Topics  . Smoking status: Never Smoker  . Smokeless tobacco: Not on file  . Alcohol use 2.5 oz/week    5 drink(s) per week     Comment: occ    Review of Systems  Constitutional: Negative for fever. Eyes: Negative for visual changes. ENT: Negative for sore throat. Cardiovascular: Chest burning as per hpi. Respiratory: Negative for shortness of breath. Gastrointestinal: Negative for vomiting and diarrhea. Genitourinary: Negative for dysuria. Musculoskeletal: Negative for back pain. Skin: Negative for rash. Neurological: Negative for headache.  ____________________________________________   PHYSICAL EXAM:  VITAL SIGNS: ED Triage Vitals  Enc Vitals Group     BP 03/27/17 0518 (!) 148/106     Pulse Rate 03/27/17 0518 (!) 104     Resp 03/27/17 0518 20     Temp 03/27/17 0518 98.6 F (37 C)     Temp Source 03/27/17 0518 Oral     SpO2 03/27/17 0518 99 %  Weight 03/27/17 0516 220 lb (99.8 kg)     Height 03/27/17 0516 6\' 1"  (1.854 m)     Head Circumference --      Peak Flow --      Pain Score 03/27/17 0516 8     Pain Loc --      Pain Edu? --      Excl. in GC? --      Constitutional: Alert and oriented. Well appearing and in no distress. HEENT   Head: Normocephalic and atraumatic.      Eyes: Conjunctivae are normal. Pupils equal and round.       Ears:         Nose: No congestion/rhinnorhea.   Mouth/Throat: Mucous membranes are moist.   Neck: No stridor. Cardiovascular/Chest:  Normal rate, regular rhythm.  No murmurs, rubs, or gallops. Respiratory: Normal respiratory effort without tachypnea nor retractions. Breath sounds are clear and equal bilaterally. No wheezes/rales/rhonchi. Gastrointestinal: Soft. No distention, no guarding, no rebound. Nontender.  Genitourinary/rectal:Deferred Musculoskeletal: Tight muscle spasm left trapezius muscle.  Some pain reported to top of shoulder with raising arms above shoulder. No joint effusions.  No lower extremity tenderness.  No edema. Neurologic:  Normal speech and language. No gross or focal neurologic deficits are appreciated. Skin:  Skin is warm, dry and intact. No rash noted. Psychiatric: Mood and affect are normal. Speech and behavior are normal. Patient exhibits appropriate insight and judgment.   ____________________________________________  LABS (pertinent positives/negatives) I, Governor Rooks, MD the attending physician have reviewed the labs noted below.  Labs Reviewed  COMPREHENSIVE METABOLIC PANEL - Abnormal; Notable for the following:       Result Value   Glucose, Bld 119 (*)    All other components within normal limits  URINALYSIS, COMPLETE (UACMP) WITH MICROSCOPIC - Abnormal; Notable for the following:    Color, Urine YELLOW (*)    APPearance CLEAR (*)    Squamous Epithelial / LPF 0-5 (*)    All other components within normal limits  LIPASE, BLOOD  CBC  TROPONIN I    ____________________________________________    EKG I, Governor Rooks, MD, the attending physician have personally viewed and interpreted all ECGs.  98 bpm.  Normal sinus rhythm.  Narrow QS.  Normal axis.  Nonspecific T wave with T wave inverted inferiorly.  Somewhat similar to 2015 EKG with inferior T wave inversion. ____________________________________________  RADIOLOGY All Xrays were viewed by me.  Imaging interpreted by Radiologist, and I, Governor Rooks, MD the attending physician have reviewed the radiologist interpretation  noted below.  CXR:  IMPRESSION: There is no active cardiopulmonary disease. __________________________________________  PROCEDURES  Procedure(s) performed: None  Critical Care performed: None  ____________________________________________  No current facility-administered medications on file prior to encounter.    Current Outpatient Prescriptions on File Prior to Encounter  Medication Sig Dispense Refill  . pantoprazole (PROTONIX) 40 MG tablet Take 1 tablet (40 mg total) by mouth 2 (two) times daily. 60 tablet 3    ____________________________________________  ED COURSE / ASSESSMENT AND PLAN  Pertinent labs & imaging results that were available during my care of the patient were reviewed by me and considered in my medical decision making (see chart for details).    Patient's clinical symptoms sound consistent with GERD.  No additional complicating factors such as bleeding ulcer is suspected.  Symptoms do not seem consistent with ACS.  Patient has not followed up with a doctor in several years.  I am referring him and recommending that he  have close follow-up with a primary care doctor within next 1-2 weeks to see how he is improving.  In addition to the epigastric discomfort, patient was having pain at the left shoulder which seems consistent with musculoskeletal pain with muscle spasm of the trapezius.  We discussed symptomatic treatment for that.  I reviewed patient's CT scan from 2013, there was indication of possible lung nodules, recommended no follow-up if no risk factors are 1 year chest CT with risk factors.  I again discussed this with the patient, asked him to follow back with his primary care doctor, and he is referred to [facility] clinic.  He does not seem to be having any symptoms that would be consistent with lung cancer at this point emergently, and I don't see an emergency indication for chest CT.  DIFFERENTIAL DIAGNOSIS: Differential diagnosis includes, but is not  limited to, ACS, aortic dissection, pulmonary embolism, cardiac tamponade, pneumothorax, pneumonia, pericarditis/myocarditis, GI-related causes including esophagitis/gastritis, and musculoskeletal chest wall pain.    CONSULTATIONS:   None   Patient / Family / Caregiver informed of clinical course, medical decision-making process, and agree with plan.   I discussed return precautions, follow-up instructions, and discharge instructions with patient and/or family.  Discharge Instructions : You were evaluated for upper abdominal burning and your exam and evaluation in the emergency department are reassuring.  I suspect gastroesophageal reflux disease.  Please avoid caffeine, soda, alcohol, juices (acidic foods), until healed approx 2-4 weeks.   Take over the counter Prilosec 40mg  twice daily for 14 days. Avoid ibuprofen, naprosyn, aleve (NSAIDS) as these can worsen acid problems. Take over the counter Maalox or Tums as needed to neutralize acid and pain with symptoms.  Return to the emergency department immediately for any worsening or uncontrolled pain, black or bloody stools, vomiting blood, fever, shortness of breath or trouble breathing, dizziness passing out, or any other symptoms concerning to you.  We discussed your left shoulder pain seems to be likely due to the left trapezius muscle spasm.  We discussed massage, heat, and avoiding the repetitive lifting to help resolve this.  Please follow-up with primary care doctor within 1-2 weeks.  Review of you past visits shows your CT scan from 2013 showed lung nodules, and initially had recommended possible repeat chest CT within 1 year depending on risk factors such as smoking, please follow-up again about this with a primary care doctor.    ___________________________________________   FINAL CLINICAL IMPRESSION(S) / ED DIAGNOSES   Final diagnoses:  Gastroesophageal reflux disease, esophagitis presence not specified               Note: This dictation was prepared with Dragon dictation. Any transcriptional errors that result from this process are unintentional    Governor Rooks, MD 03/27/17 [phone]

## 2017-03-27 NOTE — ED Notes (Signed)
Pt ambulatory with steady gait to treatment room 5; pt c/o burning pain to the center of his abd that radiates up into his chest; symptoms for about 2 weeks; pt says it feels like "fire" and says he hasn't been able to eat;

## 2019-06-04 ENCOUNTER — Emergency Department
Admission: EM | Admit: 2019-06-04 | Discharge: 2019-06-04 | Disposition: A | Discharge status: Home / Self Care | Payer: BLUE CROSS/BLUE SHIELD | Attending: Emergency Medicine | Admitting: Emergency Medicine

## 2019-06-04 ENCOUNTER — Emergency Department: Payer: BLUE CROSS/BLUE SHIELD

## 2019-06-04 ENCOUNTER — Other Ambulatory Visit: Payer: Self-pay

## 2019-06-04 ENCOUNTER — Encounter: Payer: Self-pay | Admitting: Emergency Medicine

## 2019-06-04 DIAGNOSIS — R1013 Epigastric pain: Secondary | ICD-10-CM | POA: Insufficient documentation

## 2019-06-04 DIAGNOSIS — Z20822 Contact with and (suspected) exposure to covid-19: Secondary | ICD-10-CM | POA: Diagnosis not present

## 2019-06-04 DIAGNOSIS — I10 Essential (primary) hypertension: Secondary | ICD-10-CM | POA: Insufficient documentation

## 2019-06-04 DIAGNOSIS — R42 Dizziness and giddiness: Secondary | ICD-10-CM | POA: Diagnosis present

## 2019-06-04 DIAGNOSIS — R197 Diarrhea, unspecified: Secondary | ICD-10-CM | POA: Diagnosis not present

## 2019-06-04 LAB — URINALYSIS, COMPLETE (UACMP) WITH MICROSCOPIC
Bilirubin Urine: NEGATIVE
Glucose, UA: NEGATIVE mg/dL
Hgb urine dipstick: NEGATIVE
Ketones, ur: NEGATIVE mg/dL
Leukocytes,Ua: NEGATIVE
Nitrite: NEGATIVE
Protein, ur: 30 mg/dL — AB
Specific Gravity, Urine: 1.027 (ref 1.005–1.030)
pH: 5 (ref 5.0–8.0)

## 2019-06-04 LAB — BASIC METABOLIC PANEL
Anion gap: 9 (ref 5–15)
BUN: 14 mg/dL (ref 6–20)
CO2: 29 mmol/L (ref 22–32)
Calcium: 9.1 mg/dL (ref 8.9–10.3)
Chloride: 99 mmol/L (ref 98–111)
Creatinine, Ser: 0.77 mg/dL (ref 0.61–1.24)
GFR calc Af Amer: >60 mL/min (ref >60)
GFR calc non Af Amer: >60 mL/min (ref >60)
Glucose, Bld: 108 mg/dL — ABNORMAL HIGH (ref 70–99)
Potassium: 3.8 mmol/L (ref 3.5–5.1)
Sodium: 137 mmol/L (ref 135–145)

## 2019-06-04 LAB — HEPATIC FUNCTION PANEL
ALT: 17 U/L (ref 0–44)
AST: 17 U/L (ref 15–41)
Albumin: 4 g/dL (ref 3.5–5.0)
Alkaline Phosphatase: 65 U/L (ref 38–126)
Bilirubin, Direct: 0.1 mg/dL (ref 0.0–0.2)
Indirect Bilirubin: 0.9 mg/dL (ref 0.3–0.9)
Total Bilirubin: 1 mg/dL (ref 0.3–1.2)
Total Protein: 7.8 g/dL (ref 6.5–8.1)

## 2019-06-04 LAB — CBC
HCT: 48.8 % (ref 39.0–52.0)
Hemoglobin: 16 g/dL (ref 13.0–17.0)
MCH: 28.6 pg (ref 26.0–34.0)
MCHC: 32.8 g/dL (ref 30.0–36.0)
MCV: 87.3 fL (ref 80.0–100.0)
Platelets: 326 10*3/uL (ref 150–400)
RBC: 5.59 MIL/uL (ref 4.22–5.81)
RDW: 13 % (ref 11.5–15.5)
WBC: 10.4 10*3/uL (ref 4.0–10.5)
nRBC: 0 % (ref 0.0–0.2)

## 2019-06-04 LAB — POC SARS CORONAVIRUS 2 AG: SARS Coronavirus 2 Ag: NEGATIVE

## 2019-06-04 LAB — LIPASE, BLOOD: Lipase: 26 U/L (ref 11–51)

## 2019-06-04 MED ORDER — SODIUM CHLORIDE 0.9 % IV BOLUS
1000.0000 mL | Freq: Once | INTRAVENOUS | Status: AC
  Administered 2019-06-04: 17:00:00 1000 mL via INTRAVENOUS

## 2019-06-04 MED ORDER — ONDANSETRON 4 MG PO TBDP: 4.0000 mg | ORAL_TABLET | Freq: Three times a day (TID) | ORAL | 0 refills | Status: DC | PRN

## 2019-06-04 MED ORDER — IOHEXOL 300 MG/ML  SOLN
100.0000 mL | Freq: Once | INTRAMUSCULAR | Status: AC | PRN
  Administered 2019-06-04: 17:00:00 100 mL via INTRAVENOUS
  Filled 2019-06-04: qty 100

## 2019-06-04 NOTE — ED Triage Notes (Signed)
Pt reports for the last week has been dizzy and weak and his stomach has not felt right and he has been nauseated. Pt reports pain in stomach is a burning pain that goes into his throat. Denies SOB.

## 2019-06-04 NOTE — ED Provider Notes (Signed)
[facility] Emergency Department Provider Note  ____________________________________________   First MD Initiated Contact with Patient 06/04/19 1636     (approximate)  I have reviewed the triage vital signs and the nursing notes.   HISTORY  Chief Complaint Dizziness, Weakness, Abdominal Pain, and Nausea    HPI Timothy Novak is a 49 y.o. male with below list of previous medical conditions presents to the emergency department secondary to 1 week history of diarrhea and nausea however no vomiting.  Patient does admit to dizziness with standing.  Patient denies any chest pain no shortness of breath.  Patient denies any fever no cough.  No known sick contact.        Past Medical History:  Diagnosis Date  . Esophagitis   . GERD (gastroesophageal reflux disease) age 48  . Hypertension   . Stomach ulcer age 83    Patient Active Problem List   Diagnosis Date Noted  . HTN (hypertension), benign 10/27/2011  . PUD (peptic ulcer disease) 10/27/2011  . GERD (gastroesophageal reflux disease) 10/27/2011  . Hematemesis 10/27/2011  . Hyponatremia 10/27/2011  . Lung nodules per abdominal CT, 10/27/11 10/27/2011    Past Surgical History:  Procedure Laterality Date  . ESOPHAGOGASTRODUODENOSCOPY  10/27/2011   SEVERE DISTAL Esophagitis DUE TO GERD/ SMALL Hiatal hernia  . HERNIA REPAIR      Prior to Admission medications   Medication Sig Start Date End Date Taking? Authorizing Provider  pantoprazole (PROTONIX) 40 MG tablet Take 1 tablet (40 mg total) by mouth 2 (two) times daily. 10/28/11 10/27/12  Kathie Dike, MD  sucralfate (CARAFATE) 1 g tablet Take 1 tablet (1 g total) by mouth 4 (four) times daily -  with meals and at bedtime. 03/27/17 03/27/18  Lisa Roca, MD    Allergies Patient has no known allergies.  Family History  Problem Relation Age of Onset  . Dementia Mother   . Heart failure Mother   . Hypertension Mother   . Diabetes Mother   .  Cancer Father 53       lung  . Hypertension Father   . Cancer Sister 5       ob/gyn  . Hypertension Sister     Social History Social History   Tobacco Use  . Smoking status: Never Smoker  Substance Use Topics  . Alcohol use: Yes    Alcohol/week: 5.0 standard drinks    Types: 5 drink(s) per week    Comment: occ  . Drug use: No    Review of Systems Constitutional: No fever/chills Eyes: No visual changes. ENT: No sore throat. Cardiovascular: Denies chest pain. Respiratory: Denies shortness of breath. Gastrointestinal: No abdominal pain.  Positive for nausea and diarrhea. Genitourinary: Negative for dysuria. Musculoskeletal: Negative for neck pain.  Negative for back pain. Integumentary: Negative for rash. Neurological: Negative for headaches, focal weakness or numbness.   ____________________________________________   PHYSICAL EXAM:  VITAL SIGNS: ED Triage Vitals  Enc Vitals Group     BP 06/04/19 1510 (!) 177/121     Pulse Rate 06/04/19 1510 73     Resp 06/04/19 1510 20     Temp 06/04/19 1510 99 F (37.2 C)     Temp Source 06/04/19 1510 Oral     SpO2 06/04/19 1510 100 %     Weight 06/04/19 1511 108.9 kg (240 lb)     Height 06/04/19 1511 1.854 m (6\' 1" )     Head Circumference --      Peak Flow --  Pain Score 06/04/19 1511 9     Pain Loc --      Pain Edu? --      Excl. in GC? --     Constitutional: Alert and oriented.  Eyes: Conjunctivae are normal.  Mouth/Throat: Patient is wearing a mask. Neck: No stridor.  No meningeal signs.   Cardiovascular: Normal rate, regular rhythm. Good peripheral circulation. Grossly normal heart sounds. Respiratory: Normal respiratory effort.  No retractions. Gastrointestinal: Left lower quadrant tenderness to palpation.. No distention.  Musculoskeletal: No lower extremity tenderness nor edema. No gross deformities of extremities. Neurologic:  Normal speech and language. No gross focal neurologic deficits are appreciated.   Skin:  Skin is warm, dry and intact. Psychiatric: Mood and affect are normal. Speech and behavior are normal.  ____________________________________________   LABS (all labs ordered are listed, but only abnormal results are displayed)  Labs Reviewed  BASIC METABOLIC PANEL - Abnormal; Notable for the following components:      Result Value   Glucose, Bld 108 (*)    All other components within normal limits  URINALYSIS, COMPLETE (UACMP) WITH MICROSCOPIC - Abnormal; Notable for the following components:   Color, Urine YELLOW (*)    APPearance CLEAR (*)    Protein, ur 30 (*)    Bacteria, UA RARE (*)    All other components within normal limits  CBC  LIPASE, BLOOD  HEPATIC FUNCTION PANEL  POC SARS CORONAVIRUS 2 AG -  ED  POC SARS CORONAVIRUS 2 AG   ____________________________________________  EKG  ED ECG REPORT I, [place] N Lizbett Garciagarcia, the attending physician, personally viewed and interpreted this ECG.   Date: 06/04/2019  EKG Time: 3:10 PM  Rate: 79  Rhythm: Normal sinus rhythm  Axis: Normal  intervals: Normal  ST&T Change: None  ____________________________________________  RADIOLOGY I, [place] N Halvor Behrend, personally viewed and evaluated these images (plain radiographs) as part of my medical decision making, as well as reviewing the written report by the radiologist.  ED MD interpretation:    Official radiology report(s): CT ABDOMEN PELVIS W CONTRAST  Result Date: 06/04/2019 CLINICAL DATA:  Stomach pain, nausea EXAM: CT ABDOMEN AND PELVIS WITH CONTRAST TECHNIQUE: Multidetector CT imaging of the abdomen and pelvis was performed using the standard protocol following bolus administration of intravenous contrast. CONTRAST:  OMNIPAQUE IOHEXOL 300 MG/ML  SOLN COMPARISON:  2013 FINDINGS: Lower chest: No acute abnormality. Hepatobiliary: Unremarkable. Pancreas: Unremarkable. Spleen: Unremarkable. Adrenals/Urinary Tract: Small left renal cyst. Too small to characterize right  lower pole renal lesion probably reflecting an additional cyst. 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 Otherwise unremarkable. Stomach/Bowel: Stomach is within normal limits. Appendix appears normal. No evidence of bowel wall thickening, distention, or inflammatory changes. Vascular/Lymphatic: No significant vascular findings are present. No enlarged abdominal or pelvic lymph nodes. Reproductive: Prostate is unremarkable. Other: No abdominal wall hernia or abnormality. No abdominopelvic ascites. Musculoskeletal: No acute or significant osseous finding. IMPRESSION: No findings to account for reported symptoms. Electronically Signed   By: Guadlupe Spanish M.D.   On: 06/04/2019 17:29      Procedures   ____________________________________________   INITIAL IMPRESSION / MDM / ASSESSMENT AND PLAN / ED COURSE  As part of my medical decision making, I reviewed the following data within the electronic MEDICAL RECORD NUMBER   49 year old male presented with above-stated history and physical exam with differential diagnosis including colitis diverticulitis enteritis.  CT scan of the abdomen pelvis  revealed no acute findings per radiologist.  Laboratory data unremarkable.  Patient given 2 L IV normal saline.          ____________________________________________  FINAL CLINICAL IMPRESSION(S) / ED DIAGNOSES  Final diagnoses:  Diarrhea, unspecified type     MEDICATIONS GIVEN DURING THIS VISIT:  Medications  sodium chloride 0.9 % bolus 1,000 mL (1,000 mLs Intravenous New Bag/Given 06/04/19 1706)  sodium chloride 0.9 % bolus 1,000 mL (1,000 mLs Intravenous New Bag/Given 06/04/19 1705)  iohexol (OMNIPAQUE) 300 MG/ML solution 100 mL (100 mLs Intravenous Contrast Given 06/04/19 1712)     ED Discharge Orders    None      *Please note:  CHARES SLAYMAKER was evaluated in Emergency Department on 06/04/2019 for the symptoms described in  the history of present illness. He was evaluated in the context of the global COVID-19 pandemic, which necessitated consideration that the patient might be at risk for infection with the SARS-CoV-2 virus that causes COVID-19. Institutional protocols and algorithms that pertain to the evaluation of patients at risk for COVID-19 are in a state of rapid change based on information released by regulatory bodies including the CDC and federal and state organizations. These policies and algorithms were followed during the patient's care in the ED.  Some ED evaluations and interventions may be delayed as a result of limited staffing during the pandemic.*  Note:  This document was prepared using Dragon voice recognition software and may include unintentional dictation errors.   Darci Current, MD 06/04/19 [phone]

## 2020-07-10 ENCOUNTER — Other Ambulatory Visit: Payer: Self-pay

## 2020-07-10 ENCOUNTER — Emergency Department: Payer: Self-pay

## 2020-07-10 ENCOUNTER — Emergency Department
Admission: EM | Admit: 2020-07-10 | Discharge: 2020-07-10 | Disposition: A | Discharge status: Home / Self Care | Payer: Self-pay | Attending: Emergency Medicine | Admitting: Emergency Medicine

## 2020-07-10 ENCOUNTER — Encounter: Payer: Self-pay | Admitting: Emergency Medicine

## 2020-07-10 DIAGNOSIS — R42 Dizziness and giddiness: Secondary | ICD-10-CM | POA: Insufficient documentation

## 2020-07-10 DIAGNOSIS — Z20822 Contact with and (suspected) exposure to covid-19: Secondary | ICD-10-CM | POA: Insufficient documentation

## 2020-07-10 DIAGNOSIS — Z79899 Other long term (current) drug therapy: Secondary | ICD-10-CM | POA: Insufficient documentation

## 2020-07-10 DIAGNOSIS — I10 Essential (primary) hypertension: Secondary | ICD-10-CM | POA: Insufficient documentation

## 2020-07-10 LAB — CBC
HCT: 44 % (ref 39.0–52.0)
Hemoglobin: 14.4 g/dL (ref 13.0–17.0)
MCH: 27.6 pg (ref 26.0–34.0)
MCHC: 32.7 g/dL (ref 30.0–36.0)
MCV: 84.5 fL (ref 80.0–100.0)
Platelets: 351 10*3/uL (ref 150–400)
RBC: 5.21 MIL/uL (ref 4.22–5.81)
RDW: 13.2 % (ref 11.5–15.5)
WBC: 11.1 10*3/uL — ABNORMAL HIGH (ref 4.0–10.5)
nRBC: 0 % (ref 0.0–0.2)

## 2020-07-10 LAB — BASIC METABOLIC PANEL
Anion gap: 10 (ref 5–15)
BUN: 11 mg/dL (ref 6–20)
CO2: 27 mmol/L (ref 22–32)
Calcium: 9.3 mg/dL (ref 8.9–10.3)
Chloride: 101 mmol/L (ref 98–111)
Creatinine, Ser: 0.76 mg/dL (ref 0.61–1.24)
GFR, Estimated: >60 mL/min (ref >60)
Glucose, Bld: 104 mg/dL — ABNORMAL HIGH (ref 70–99)
Potassium: 3.8 mmol/L (ref 3.5–5.1)
Sodium: 138 mmol/L (ref 135–145)

## 2020-07-10 LAB — TROPONIN I (HIGH SENSITIVITY): Troponin I (High Sensitivity): 4 ng/L (ref <18)

## 2020-07-10 MED ORDER — CLONIDINE HCL 0.1 MG PO TABS
0.2000 mg | ORAL_TABLET | Freq: Once | ORAL | Status: AC
  Administered 2020-07-10: 0.2 mg via ORAL
  Filled 2020-07-10: qty 2

## 2020-07-10 MED ORDER — AMLODIPINE BESYLATE 5 MG PO TABS: 5.0000 mg | ORAL_TABLET | Freq: Every day | ORAL | 11 refills | Status: AC

## 2020-07-10 NOTE — ED Notes (Signed)
Pt states an increased blood pressure over the last several days. Pt states years ago he took medications for BP but it got better so he stopped taking them. Pt states his head feels a little foggy as well.

## 2020-07-10 NOTE — Discharge Instructions (Signed)
Follow-up with either of the primary care office as listed above. Take your blood pressure medication as prescribed Have your blood pressure rechecked at either fire station or pharmacy and 2 to 3 days. Return emergency department if you feel that you are worsening. Covid test should result in 24 hours. Please sign up for [facility] MyChart see you can see your result. I cannot guarantee that you will get a phone call

## 2020-07-10 NOTE — ED Triage Notes (Signed)
See first RN note; pt c/o dizziness and high blood pressure, pt states hx of htn but has not been on medication since his 20's.

## 2020-07-10 NOTE — ED Provider Notes (Signed)
[facility] Emergency Department Provider Note  ____________________________________________   Event Date/Time   First MD Initiated Contact with Patient 07/10/20 1551     (approximate)  I have reviewed the triage vital signs and the nursing notes.   HISTORY  Chief Complaint Hypertension    HPI Timothy Novak is a 50 y.o. male presents to the emergency department complaining of dizziness and elevated blood pressure.  Patient states for the past 6 months he is woken up with blurred vision which clears.  He has had a headache for the past 3 days.  Checked his blood pressure at the urgent care and it was almost 200 over something.  States they gave him his co-pay back told to come to the emergency department.  He denies chest pain or shortness of breath.  Patient states he did have high blood pressure in his 20s and has not been treated since then.  Every time he checks his blood pressure at [facility] it is elevated.   Past Medical History:  Diagnosis Date  . Esophagitis   . GERD (gastroesophageal reflux disease) age 89  . Hypertension   . Stomach ulcer age 66    Patient Active Problem List   Diagnosis Date Noted  . HTN (hypertension), benign 10/27/2011  . PUD (peptic ulcer disease) 10/27/2011  . GERD (gastroesophageal reflux disease) 10/27/2011  . Hematemesis 10/27/2011  . Hyponatremia 10/27/2011  . Lung nodules per abdominal CT, 10/27/11 10/27/2011    Past Surgical History:  Procedure Laterality Date  . ESOPHAGOGASTRODUODENOSCOPY  10/27/2011   SEVERE DISTAL Esophagitis DUE TO GERD/ SMALL Hiatal hernia  . HERNIA REPAIR      Prior to Admission medications   Medication Sig Start Date End Date Taking? Authorizing Provider  amLODipine (NORVASC) 5 MG tablet Take 1 tablet (5 mg total) by mouth daily. 07/10/20 07/10/21 Yes Fisher, Roselyn Bering, PA-C  ondansetron (ZOFRAN ODT) 4 MG disintegrating tablet Take 1 tablet (4 mg total) by mouth every 8 (eight) hours  as needed. 06/04/19   Darci Current, MD  pantoprazole (PROTONIX) 40 MG tablet Take 1 tablet (40 mg total) by mouth 2 (two) times daily. 10/28/11 10/27/12  Erick Blinks, MD  sucralfate (CARAFATE) 1 g tablet Take 1 tablet (1 g total) by mouth 4 (four) times daily -  with meals and at bedtime. 03/27/17 03/27/18  Governor Rooks, MD    Allergies Patient has no known allergies.  Family History  Problem Relation Age of Onset  . Dementia Mother   . Heart failure Mother   . Hypertension Mother   . Diabetes Mother   . Cancer Father 27       lung  . Hypertension Father   . Cancer Sister 44       ob/gyn  . Hypertension Sister     Social History Social History   Tobacco Use  . Smoking status: Never Smoker  Substance Use Topics  . Alcohol use: Yes    Alcohol/week: 5.0 standard drinks    Types: 5 drink(s) per week    Comment: occ  . Drug use: No    Review of Systems  Constitutional: No fever/chills Eyes: Blurred vision in the mornings ENT: No sore throat. Respiratory: Denies cough Cardiovascular: Denies chest pain Gastrointestinal: Denies abdominal pain Genitourinary: Negative for dysuria. Musculoskeletal: Negative for back pain. Skin: Negative for rash. Psychiatric: no mood changes,     ____________________________________________   PHYSICAL EXAM:  VITAL SIGNS: ED Triage Vitals  Enc Vitals Group  BP 07/10/20 1450 (!) 158/126     Pulse Rate 07/10/20 1450 81     Resp 07/10/20 1450 18     Temp 07/10/20 1450 98.2 F (36.8 C)     Temp Source 07/10/20 1450 Oral     SpO2 07/10/20 1451 98 %     Weight 07/10/20 1502 245 lb (111.1 kg)     Height 07/10/20 1502 6\' 1"  (1.854 m)     Head Circumference --      Peak Flow --      Pain Score 07/10/20 1502 0     Pain Loc --      Pain Edu? --      Excl. in GC? --     Constitutional: Alert and oriented. Well appearing and in no acute distress. Eyes: Conjunctivae are normal.  Head: Atraumatic. Nose: No  congestion/rhinnorhea. Mouth/Throat: Mucous membranes are moist.  Neck:  supple no lymphadenopathy noted Cardiovascular: Normal rate, regular rhythm. Heart sounds are normal Respiratory: Normal respiratory effort.  No retractions, lungs c t a  GU: deferred Musculoskeletal: FROM all extremities, warm and well perfused Neurologic:  Normal speech and language.  Skin:  Skin is warm, dry and intact. No rash noted. Psychiatric: Mood and affect are normal. Speech and behavior are normal.  ____________________________________________   LABS (all labs ordered are listed, but only abnormal results are displayed)  Labs Reviewed  BASIC METABOLIC PANEL - Abnormal; Notable for the following components:      Result Value   Glucose, Bld 104 (*)    All other components within normal limits  CBC - Abnormal; Notable for the following components:   WBC 11.1 (*)    All other components within normal limits  SARS CORONAVIRUS 2 (TAT 6-24 HRS)  TROPONIN I (HIGH SENSITIVITY)  TROPONIN I (HIGH SENSITIVITY)   ____________________________________________   ____________________________________________  RADIOLOGY  CT of the head  ____________________________________________   PROCEDURES  Procedure(s) performed: No  Procedures    ____________________________________________   INITIAL IMPRESSION / ASSESSMENT AND PLAN / ED COURSE  Pertinent labs & imaging results that were available during my care of the patient were reviewed by me and considered in my medical decision making (see chart for details).   Patient is 50 year old male presents with elevated blood pressure and dizziness.  See HPI.  Physical exam shows patient to appear stable.  Blood pressure is elevated, remainder of exam is unremarkable  DDx: Essential hypertension, Covid, lacunar infarct, brain tumor  CBC has a elevated WBC of 11.1, basic metabolic panel was normal, troponins negative  CT of the head  EKG showed normal  sinus rhythm, see physician read   CT of the head shows no change since 2011.  Patient's blood pressure did decrease some with the clonidine.  Patient states he feels fine.  Due to the worsening headache over the last 3 days we will do a Covid test at discharge.  Patient was given a prescription for amlodipine 5 mg daily.  He is to recheck his blood pressure in 2 to 3 days.  Return emergency department worsening.  He was discharged stable condition.  Timothy Novak was evaluated in Emergency Department on 07/10/2020 for the symptoms described in the history of present illness. He was evaluated in the context of the global COVID-19 pandemic, which necessitated consideration that the patient might be at risk for infection with the SARS-CoV-2 virus that causes COVID-19. Institutional protocols and algorithms that pertain to the evaluation of patients at risk for  COVID-19 are in a state of rapid change based on information released by regulatory bodies including the CDC and federal and state organizations. These policies and algorithms were followed during the patient's care in the ED.    As part of my medical decision making, I reviewed the following data within the electronic MEDICAL RECORD NUMBER Nursing notes reviewed and incorporated, Labs reviewed , EKG interpreted NSR, see physician read, Old chart reviewed, Radiograph reviewed , Notes from prior ED visits and [place] Controlled Substance Database  ____________________________________________   FINAL CLINICAL IMPRESSION(S) / ED DIAGNOSES  Final diagnoses:  Primary hypertension  Dizziness      NEW MEDICATIONS STARTED DURING THIS VISIT:  New Prescriptions   AMLODIPINE (NORVASC) 5 MG TABLET    Take 1 tablet (5 mg total) by mouth daily.     Note:  This document was prepared using Dragon voice recognition software and may include unintentional dictation errors.    Faythe Ghee, PA-C 07/10/20 1720    Merwyn Katos, MD 07/10/20 [id number]

## 2020-07-10 NOTE — ED Notes (Signed)
First Nurse Note: Pt to ED for dizzy. Pt states that he went to [facility] walk in but was told to come the ED because his BP was around 180/130. Pt ambulatory without difficulty.

## 2020-07-11 LAB — SARS CORONAVIRUS 2 (TAT 6-24 HRS): SARS Coronavirus 2: NEGATIVE

## 2020-08-01 ENCOUNTER — Emergency Department
Admission: EM | Admit: 2020-08-01 | Discharge: 2020-08-01 | Disposition: A | Discharge status: Home / Self Care | Payer: BC Managed Care – PPO | Attending: Emergency Medicine | Admitting: Emergency Medicine

## 2020-08-01 ENCOUNTER — Encounter: Payer: Self-pay | Admitting: Emergency Medicine

## 2020-08-01 ENCOUNTER — Other Ambulatory Visit: Payer: Self-pay

## 2020-08-01 ENCOUNTER — Emergency Department: Payer: BC Managed Care – PPO

## 2020-08-01 DIAGNOSIS — I10 Essential (primary) hypertension: Secondary | ICD-10-CM | POA: Diagnosis not present

## 2020-08-01 DIAGNOSIS — Z79899 Other long term (current) drug therapy: Secondary | ICD-10-CM | POA: Diagnosis not present

## 2020-08-01 DIAGNOSIS — R1030 Lower abdominal pain, unspecified: Secondary | ICD-10-CM | POA: Diagnosis present

## 2020-08-01 DIAGNOSIS — N132 Hydronephrosis with renal and ureteral calculous obstruction: Secondary | ICD-10-CM | POA: Diagnosis not present

## 2020-08-01 LAB — URINALYSIS, COMPLETE (UACMP) WITH MICROSCOPIC
Bacteria, UA: NONE SEEN
Bilirubin Urine: NEGATIVE
Glucose, UA: NEGATIVE mg/dL
Ketones, ur: NEGATIVE mg/dL
Leukocytes,Ua: NEGATIVE
Nitrite: NEGATIVE
Protein, ur: NEGATIVE mg/dL
Specific Gravity, Urine: 1.041 — ABNORMAL HIGH (ref 1.005–1.030)
pH: 5 (ref 5.0–8.0)

## 2020-08-01 LAB — COMPREHENSIVE METABOLIC PANEL
ALT: 16 U/L (ref 0–44)
AST: 17 U/L (ref 15–41)
Albumin: 4.2 g/dL (ref 3.5–5.0)
Alkaline Phosphatase: 74 U/L (ref 38–126)
Anion gap: 8 (ref 5–15)
BUN: 13 mg/dL (ref 6–20)
CO2: 31 mmol/L (ref 22–32)
Calcium: 9.6 mg/dL (ref 8.9–10.3)
Chloride: 102 mmol/L (ref 98–111)
Creatinine, Ser: 0.9 mg/dL (ref 0.61–1.24)
GFR, Estimated: >60 mL/min (ref >60)
Glucose, Bld: 104 mg/dL — ABNORMAL HIGH (ref 70–99)
Potassium: 3.3 mmol/L — ABNORMAL LOW (ref 3.5–5.1)
Sodium: 141 mmol/L (ref 135–145)
Total Bilirubin: 0.6 mg/dL (ref 0.3–1.2)
Total Protein: 8 g/dL (ref 6.5–8.1)

## 2020-08-01 LAB — CBC
HCT: 45.7 % (ref 39.0–52.0)
Hemoglobin: 15.4 g/dL (ref 13.0–17.0)
MCH: 28.1 pg (ref 26.0–34.0)
MCHC: 33.7 g/dL (ref 30.0–36.0)
MCV: 83.2 fL (ref 80.0–100.0)
Platelets: 422 10*3/uL — ABNORMAL HIGH (ref 150–400)
RBC: 5.49 MIL/uL (ref 4.22–5.81)
RDW: 13.2 % (ref 11.5–15.5)
WBC: 13.3 10*3/uL — ABNORMAL HIGH (ref 4.0–10.5)
nRBC: 0 % (ref 0.0–0.2)

## 2020-08-01 LAB — LIPASE, BLOOD: Lipase: 31 U/L (ref 11–51)

## 2020-08-01 MED ORDER — ONDANSETRON HCL 4 MG/2ML IJ SOLN
4.0000 mg | Freq: Once | INTRAMUSCULAR | Status: AC
  Administered 2020-08-01: 4 mg via INTRAVENOUS
  Filled 2020-08-01: qty 2

## 2020-08-01 MED ORDER — MORPHINE SULFATE (PF) 4 MG/ML IV SOLN
4.0000 mg | Freq: Once | INTRAVENOUS | Status: AC
  Administered 2020-08-01: 4 mg via INTRAVENOUS
  Filled 2020-08-01: qty 1

## 2020-08-01 MED ORDER — TAMSULOSIN HCL 0.4 MG PO CAPS: ORAL_CAPSULE | ORAL | 0 refills | Status: DC

## 2020-08-01 MED ORDER — OXYCODONE-ACETAMINOPHEN 5-325 MG PO TABS: 2.0000 | ORAL_TABLET | Freq: Four times a day (QID) | ORAL | 0 refills | Status: AC | PRN

## 2020-08-01 MED ORDER — LACTATED RINGERS IV BOLUS
1000.0000 mL | Freq: Once | INTRAVENOUS | Status: AC
  Administered 2020-08-01: 1000 mL via INTRAVENOUS

## 2020-08-01 MED ORDER — OXYCODONE-ACETAMINOPHEN 5-325 MG PO TABS
2.0000 | ORAL_TABLET | Freq: Once | ORAL | Status: AC
  Administered 2020-08-01: 2 via ORAL
  Filled 2020-08-01: qty 2

## 2020-08-01 MED ORDER — KETOROLAC TROMETHAMINE 30 MG/ML IJ SOLN
15.0000 mg | Freq: Once | INTRAMUSCULAR | Status: AC
  Administered 2020-08-01: 15 mg via INTRAVENOUS
  Filled 2020-08-01: qty 1

## 2020-08-01 MED ORDER — ONDANSETRON 4 MG PO TBDP: ORAL_TABLET | ORAL | 0 refills | Status: AC

## 2020-08-01 MED ORDER — DOCUSATE SODIUM 100 MG PO CAPS: ORAL_CAPSULE | ORAL | 0 refills | Status: DC

## 2020-08-01 MED ORDER — OXYCODONE-ACETAMINOPHEN 5-325 MG PO TABS: 2.0000 | ORAL_TABLET | Freq: Four times a day (QID) | ORAL | 0 refills | Status: DC | PRN

## 2020-08-01 MED ORDER — ONDANSETRON 4 MG PO TBDP: ORAL_TABLET | ORAL | 0 refills | Status: DC

## 2020-08-01 MED ORDER — TAMSULOSIN HCL 0.4 MG PO CAPS: ORAL_CAPSULE | ORAL | 0 refills | Status: AC

## 2020-08-01 MED ORDER — DOCUSATE SODIUM 100 MG PO CAPS: ORAL_CAPSULE | ORAL | 0 refills | Status: AC

## 2020-08-01 MED ORDER — IOHEXOL 300 MG/ML  SOLN
100.0000 mL | Freq: Once | INTRAMUSCULAR | Status: AC | PRN
  Administered 2020-08-01: 100 mL via INTRAVENOUS

## 2020-08-01 NOTE — Discharge Instructions (Addendum)
You have been seen in the Emergency Department (ED) today for pain caused by kidney stones.  As we have discussed, please drink plenty of fluids.  Please make a follow up appointment with the physician(s) listed elsewhere in this documentation.  You may take pain medication as needed but ONLY as prescribed.  Please also take your prescribed Flomax daily.  We also recommend that you take over-the-counter ibuprofen regularly according to label instructions over the next 5 days.  Take it with meals to minimize stomach discomfort.  Please see your doctor as soon as possible as stones may take many days to pass and you may require additional care or medications.  Do not drink alcohol, drive or participate in any other potentially dangerous activities while taking opiate pain medication as it may make you sleepy. Do not take this medication with any other sedating medications, either prescription or over-the-counter. If you were prescribed Percocet or Vicodin, do not take these with acetaminophen (Tylenol) as it is already contained within these medications.   Take Percocet as needed for severe pain.  This medication is an opiate (or narcotic) pain medication and can be habit forming.  Use it as little as possible to achieve adequate pain control.  Do not use or use it with extreme caution if you have a history of opiate abuse or dependence.  If you are on a pain contract with your primary care doctor or a pain specialist, be sure to let them know you were prescribed this medication today from the [facility] Emergency Department.  This medication is intended for your use only - do not give any to anyone else and keep it in a secure place where nobody else, especially children, have access to it.  It will also cause or worsen constipation, so you may want to consider taking an over-the-counter stool softener while you are taking this medication.  Return to the Emergency Department (ED) or call your doctor  if you have any worsening pain, fever, painful urination, are unable to urinate, or develop other symptoms that concern you.

## 2020-08-01 NOTE — ED Notes (Signed)
D/C and new RX's discussed with pt, pt verbalized understanding. NAD noted. Pt states family will be picking him up. Pt ambulatory at D/C. Pt A&Ox4 on D/C.

## 2020-08-01 NOTE — ED Notes (Signed)
ED Provider at bedside. 

## 2020-08-01 NOTE — ED Triage Notes (Signed)
Pt in with constant, sharp generalized abdominal pain x 3-4 days. Reports some emesis, 1 episode of diarrhea. Denies any cp or sob, says the pain radiates to back bilaterally.

## 2020-08-01 NOTE — ED Provider Notes (Signed)
Fluids complete, ua reassuring   Jene Every, MD 08/01/20 [phone]

## 2020-08-01 NOTE — ED Notes (Signed)
Pt to ct 

## 2020-08-01 NOTE — ED Provider Notes (Signed)
[facility] Emergency Department Provider Note  ____________________________________________   Event Date/Time   First MD Initiated Contact with Patient 08/01/20 [phone]     (approximate)  I have reviewed the triage vital signs and the nursing notes.   HISTORY  Chief Complaint Abdominal Pain    HPI Timothy Novak is a 50 y.o. male his medical history is most notable for poorly controlled hypertension as well as a history of some acid reflux and peptic ulcer disease.  He presents for evaluation of 3 to 4 days of gradually worsening of lower abdominal pain.  He said that the pain is located below his bellybutton in the middle but radiates to both sides and occasionally to his back.  The pain waxes and wanes but has not gone away for 3 to 4 days.  Nothing in particular seems to make it better or worse.  He has not had much of an appetite and has had very little to eat or drink over the last 3 to 4 days.  He has had  some nausea and vomiting but not for a couple of days but has had very little to eat.  He has had an occasional bowel movement but not regularly.  He has not had symptoms like this in the past and says he has no pain in his upper abdomen.  He denies fever/chills, sore throat, chest pain, shortness of breath, cough.  He has had no increased urinary frequency or dysuria.        Past Medical History:  Diagnosis Date  . Esophagitis   . GERD (gastroesophageal reflux disease) age 34  . Hypertension   . Stomach ulcer age 72    Patient Active Problem List   Diagnosis Date Noted  . HTN (hypertension), benign 10/27/2011  . PUD (peptic ulcer disease) 10/27/2011  . GERD (gastroesophageal reflux disease) 10/27/2011  . Hematemesis 10/27/2011  . Hyponatremia 10/27/2011  . Lung nodules per abdominal CT, 10/27/11 10/27/2011    Past Surgical History:  Procedure Laterality Date  . ESOPHAGOGASTRODUODENOSCOPY  10/27/2011   SEVERE DISTAL Esophagitis DUE TO  GERD/ SMALL Hiatal hernia  . HERNIA REPAIR      Prior to Admission medications   Medication Sig Start Date End Date Taking? Authorizing Provider  docusate sodium (COLACE) 100 MG capsule Take 1 tablet once or twice daily as needed for constipation while taking narcotic pain medicine 08/01/20  Yes Loleta Rose, MD  ondansetron (ZOFRAN ODT) 4 MG disintegrating tablet Allow 1-2 tablets to dissolve in your mouth every 8 hours as needed for nausea/vomiting 08/01/20  Yes Loleta Rose, MD  oxyCODONE-acetaminophen (PERCOCET) 5-325 MG tablet Take 2 tablets by mouth every 6 (six) hours as needed for severe pain. 08/01/20  Yes Loleta Rose, MD  tamsulosin (FLOMAX) 0.4 MG CAPS capsule Take 1 tablet by mouth daily until you pass the kidney stone or no longer have symptoms 08/01/20  Yes Loleta Rose, MD  amLODipine (NORVASC) 5 MG tablet Take 1 tablet (5 mg total) by mouth daily. 07/10/20 07/10/21  Fisher, Roselyn Bering, PA-C  pantoprazole (PROTONIX) 40 MG tablet Take 1 tablet (40 mg total) by mouth 2 (two) times daily. 10/28/11 10/27/12  Erick Blinks, MD  sucralfate (CARAFATE) 1 g tablet Take 1 tablet (1 g total) by mouth 4 (four) times daily -  with meals and at bedtime. 03/27/17 03/27/18  Governor Rooks, MD    Allergies Patient has no known allergies.  Family History  Problem Relation Age of Onset  .  Dementia Mother   . Heart failure Mother   . Hypertension Mother   . Diabetes Mother   . Cancer Father 68       lung  . Hypertension Father   . Cancer Sister 32       ob/gyn  . Hypertension Sister     Social History Social History   Tobacco Use  . Smoking status: Never Smoker  . Smokeless tobacco: Never Used  Substance Use Topics  . Alcohol use: Yes    Alcohol/week: 5.0 standard drinks    Types: 5 Standard drinks or equivalent per week    Comment: occ  . Drug use: No    Review of Systems Constitutional: No fever/chills Eyes: No visual changes. ENT: No sore throat. Cardiovascular: Denies chest  pain. Respiratory: Denies shortness of breath. Gastrointestinal: Lower middle abdominal pain with occasional nausea and vomiting. Genitourinary: Negative for dysuria. Musculoskeletal: Negative for neck pain.  Negative for back pain. Integumentary: Negative for rash. Neurological: Negative for headaches, focal weakness or numbness.   ____________________________________________   PHYSICAL EXAM:  VITAL SIGNS: ED Triage Vitals  Enc Vitals Group     BP 08/01/20 0521 (!) 176/116     Pulse Rate 08/01/20 0521 87     Resp 08/01/20 0521 18     Temp 08/01/20 0521 97.9 F (36.6 C)     Temp Source 08/01/20 0521 Oral     SpO2 08/01/20 0521 99 %     Weight 08/01/20 0519 110.2 kg (243 lb)     Height --      Head Circumference --      Peak Flow --      Pain Score 08/01/20 0523 9     Pain Loc --      Pain Edu? --      Excl. in GC? --     Constitutional: Alert and oriented.  Appears uncomfortable but not in severe distress. Eyes: Conjunctivae are normal.  Head: Atraumatic. Nose: No congestion/rhinnorhea. Mouth/Throat: Patient is wearing a mask. Neck: No stridor.  No meningeal signs.   Cardiovascular: Normal rate, regular rhythm. Good peripheral circulation. Respiratory: Normal respiratory effort.  No retractions. Gastrointestinal: Soft and nondistended.  Tender to palpation in the suprapubic/infraumbilical region.  There is some degree of guarding, equivocal rebound.  No tenderness to palpation of the epigastrium nor the right upper quadrant with negative Murphy sign. Musculoskeletal: No lower extremity tenderness nor edema. No gross deformities of extremities. Neurologic:  Normal speech and language. No gross focal neurologic deficits are appreciated.  Skin:  Skin is warm, dry and intact. Psychiatric: Mood and affect are normal. Speech and behavior are normal.  ____________________________________________   LABS (all labs ordered are listed, but only abnormal results are  displayed)  Labs Reviewed  COMPREHENSIVE METABOLIC PANEL - Abnormal; Notable for the following components:      Result Value   Potassium 3.3 (*)    Glucose, Bld 104 (*)    All other components within normal limits  CBC - Abnormal; Notable for the following components:   WBC 13.3 (*)    Platelets 422 (*)    All other components within normal limits  LIPASE, BLOOD  URINALYSIS, COMPLETE (UACMP) WITH MICROSCOPIC   ____________________________________________  EKG  No indication for emergent ____________________________________________  RADIOLOGY Marylou Mccoy, personally viewed and evaluated these images (plain radiographs) as part of my medical decision making, as well as reviewing the written report by the radiologist.  ED MD interpretation: Obstructing 3 mm stone  at the right UVJ, no other acute abnormalities.  Official radiology report(s): CT ABDOMEN PELVIS W CONTRAST  Result Date: 08/01/2020 CLINICAL DATA:  Constant and sharp generalized abdominal pain for 3-4 days, possible diverticulitis EXAM: CT ABDOMEN AND PELVIS WITH CONTRAST TECHNIQUE: Multidetector CT imaging of the abdomen and pelvis was performed using the standard protocol following bolus administration of intravenous contrast. CONTRAST:  100mL OMNIPAQUE IOHEXOL 300 MG/ML  SOLN COMPARISON:  06/04/2019 FINDINGS: Lower chest:  No contributory findings. Hepatobiliary: No focal liver abnormality.No evidence of biliary obstruction or stone. Pancreas: Unremarkable. Spleen: Unremarkable. 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 Right hydroureteronephrosis due to a 3 mm stone at the UVJ where there is probable ureteral and bladder base edema. Small bilateral renal cystic densities. Stomach/Bowel:  No obstruction. No visible bowel inflammation Vascular/Lymphatic: No acute vascular abnormality. No mass or adenopathy. Reproductive:No pathologic findings. Other: No ascites or  pneumoperitoneum. Musculoskeletal: No acute abnormalities. IMPRESSION: Obstructing 3 mm stone at the right UVJ. Electronically Signed   By: Marnee SpringJonathon  Watts M.D.   On: 08/01/2020 06:40    ____________________________________________   PROCEDURES   Procedure(s) performed (including Critical Care):  Procedures   ____________________________________________   INITIAL IMPRESSION / MDM / ASSESSMENT AND PLAN / ED COURSE  As part of my medical decision making, I reviewed the following data within the electronic MEDICAL RECORD NUMBER Nursing notes reviewed and incorporated, Labs reviewed , Old chart reviewed, Notes from prior ED visits and [place] Controlled Substance Database   Differential diagnosis includes, but is not limited to, diverticulitis, appendicitis, SBO/ileus, AAA, aortic dissection, renal/ureteral colic, UTI/pyelonephritis, epiploic appendagitis, biliary colic, peptic/gastric ulcer disease, constipation.  Given the gradual onset over the last few days but persistent pain in his lower abdomen, I suspect diverticulitis or constipation, or even partial SBO, are the more likely diagnoses.  However he has never been evaluated for AAA in the past and I am somewhat concerned about his description of pain radiating through to his back.  However the pain is a bit more inferior than I would expect for pain from a AAA, and his hypertension (which is chronic) which is better than hypotension which would further make me suspicious for rupture.  Given my increased level of concern, however, I brought the ultrasound into the room and performed a bedside aortic ultrasound.  Visualization was poor due to body habitus and bowel gas but I was able to visualize the suprarenal abdominal aorta and I did not visualize any abnormalities.  I could not trace the aorta in the lower abdomen because it caused him a substantial amount of pain due to how hard had to press to try to visualize through the bowel gas, but I also  did not visualize any intra-abdominal fluid or obvious aneurysm.  While I was performing the exam the patient's labs came back and they are notable for a leukocytosis of 13.3 and a mild hypokalemia 3.3.  Also notable on the ultrasound was that the patient's volume status seemed low with an easily collapsible IVC.  I am providing 1 L lactated ringer IV bolus and I ordered morphine 4 mg IV and Zofran 4 mg IV for analgesia and antiemetic.  Patient understands the plan and agrees to the imaging and reassessment after CT scan of the abdomen and pelvis with IV contrast.  I feel like this will be more appropriate and beneficial than a CTA chest/abdomen/pelvis for dissection.     Clinical Course as of 08/01/20 [phone] [id number]  Sat Aug 01, 2020  0703 CT ABDOMEN PELVIS W CONTRAST CT scan is notable for a 3 mm ureteral stone at the UVJ on the right side with some hydronephrosis.  I have dated the patient had my usual and customary kidney stone discussion.  Given his volume depletion he needs to finish the liter of fluids and provide a urine specimen to make sure there is no indication for antibiotics, but the patient shows no sign of sepsis and is at his baseline degree of hypertension.  The plan is for discharge with prescriptions as listed below and outpatient follow-up with urology.  I gave my usual and customary return precautions. [CF]    Clinical Course User Index [CF] Loleta Rose, MD     ____________________________________________  FINAL CLINICAL IMPRESSION(S) / ED DIAGNOSES  Final diagnoses:  Ureteral stone with hydronephrosis     MEDICATIONS GIVEN DURING THIS VISIT:  Medications  ketorolac (TORADOL) 30 MG/ML injection 15 mg (has no administration in time range)  oxyCODONE-acetaminophen (PERCOCET/ROXICET) 5-325 MG per tablet 2 tablet (has no administration in time range)  morphine 4 MG/ML injection 4 mg (4 mg Intravenous Given 08/01/20 0544)  ondansetron (ZOFRAN) injection 4 mg (4 mg Intravenous  Given 08/01/20 0544)  lactated ringers bolus 1,000 mL (1,000 mLs Intravenous New Bag/Given 08/01/20 0606)  iohexol (OMNIPAQUE) 300 MG/ML solution 100 mL (100 mLs Intravenous Contrast Given 08/01/20 3335)     ED Discharge Orders         Ordered    oxyCODONE-acetaminophen (PERCOCET) 5-325 MG tablet  Every 6 hours PRN        08/01/20 0710    ondansetron (ZOFRAN ODT) 4 MG disintegrating tablet        08/01/20 0710    tamsulosin (FLOMAX) 0.4 MG CAPS capsule        08/01/20 0710    docusate sodium (COLACE) 100 MG capsule        08/01/20 0710          *Please note:  Timothy Novak was evaluated in Emergency Department on 08/01/2020 for the symptoms described in the history of present illness. He was evaluated in the context of the global COVID-19 pandemic, which necessitated consideration that the patient might be at risk for infection with the SARS-CoV-2 virus that causes COVID-19. Institutional protocols and algorithms that pertain to the evaluation of patients at risk for COVID-19 are in a state of rapid change based on information released by regulatory bodies including the CDC and federal and state organizations. These policies and algorithms were followed during the patient's care in the ED.  Some ED evaluations and interventions may be delayed as a result of limited staffing during and after the pandemic.*  Note:  This document was prepared using Dragon voice recognition software and may include unintentional dictation errors.   Loleta Rose, MD 08/01/20 [phone]

## 2021-02-02 ENCOUNTER — Other Ambulatory Visit: Payer: Self-pay

## 2021-02-02 ENCOUNTER — Emergency Department
Admission: EM | Admit: 2021-02-02 | Discharge: 2021-02-02 | Disposition: A | Discharge status: Home / Self Care | Payer: BC Managed Care – PPO | Attending: Emergency Medicine | Admitting: Emergency Medicine

## 2021-02-02 ENCOUNTER — Encounter: Payer: Self-pay | Admitting: Emergency Medicine

## 2021-02-02 DIAGNOSIS — M25512 Pain in left shoulder: Secondary | ICD-10-CM | POA: Insufficient documentation

## 2021-02-02 DIAGNOSIS — S29019A Strain of muscle and tendon of unspecified wall of thorax, initial encounter: Secondary | ICD-10-CM

## 2021-02-02 DIAGNOSIS — Y9389 Activity, other specified: Secondary | ICD-10-CM | POA: Insufficient documentation

## 2021-02-02 DIAGNOSIS — X503XXA Overexertion from repetitive movements, initial encounter: Secondary | ICD-10-CM | POA: Insufficient documentation

## 2021-02-02 DIAGNOSIS — Z79899 Other long term (current) drug therapy: Secondary | ICD-10-CM | POA: Insufficient documentation

## 2021-02-02 DIAGNOSIS — I10 Essential (primary) hypertension: Secondary | ICD-10-CM | POA: Diagnosis not present

## 2021-02-02 DIAGNOSIS — Y9263 Factory as the place of occurrence of the external cause: Secondary | ICD-10-CM | POA: Insufficient documentation

## 2021-02-02 DIAGNOSIS — S29012A Strain of muscle and tendon of back wall of thorax, initial encounter: Secondary | ICD-10-CM | POA: Insufficient documentation

## 2021-02-02 DIAGNOSIS — S29002A Unspecified injury of muscle and tendon of back wall of thorax, initial encounter: Secondary | ICD-10-CM | POA: Diagnosis present

## 2021-02-02 DIAGNOSIS — Y990 Civilian activity done for income or pay: Secondary | ICD-10-CM | POA: Diagnosis not present

## 2021-02-02 MED ORDER — NAPROXEN 500 MG PO TABS: 500.0000 mg | ORAL_TABLET | Freq: Two times a day (BID) | ORAL | 0 refills | Status: AC

## 2021-02-02 MED ORDER — ACETAMINOPHEN 325 MG PO TABS: 650.0000 mg | ORAL_TABLET | Freq: Four times a day (QID) | ORAL | 0 refills | Status: AC | PRN

## 2021-02-02 NOTE — ED Triage Notes (Signed)
Patient ambulatory to triage with steady gait, without difficulty or distress noted; pt reports last 2wks having left sided neck/shoulder pain that increases with ROM

## 2021-02-02 NOTE — ED Provider Notes (Signed)
[facility] Emergency Department Provider Note  ____________________________________________  Time seen: Approximately 7:49 AM  I have reviewed the triage vital signs and the nursing notes.   HISTORY  Chief Complaint Left shoulder pain   HPI Timothy Novak is a 50 y.o. male who complains of pain in the left shoulder that radiates into the left upper arm that has been present for the past 2 weeks.  Intermittent, worsening, worse with movement.  Better with laying still.  Denies chest pain or shortness of breath.  No vision changes headache or motor weakness.  He does have a feeling of paresthesia in the left hand when the pain is present.  He works Designer, television/film set at [employer].  He reports repetitive tasks of manually tearing seems of fabric apart which do aggravate the pain.    Past Medical History:  Diagnosis Date   Esophagitis    GERD (gastroesophageal reflux disease) age 75   Hypertension    Stomach ulcer age 38     Patient Active Problem List   Diagnosis Date Noted   HTN (hypertension), benign 10/27/2011   PUD (peptic ulcer disease) 10/27/2011   GERD (gastroesophageal reflux disease) 10/27/2011   Hematemesis 10/27/2011   Hyponatremia 10/27/2011   Lung nodules per abdominal CT, 10/27/11 10/27/2011     Past Surgical History:  Procedure Laterality Date   ESOPHAGOGASTRODUODENOSCOPY  10/27/2011   SEVERE DISTAL Esophagitis DUE TO GERD/ SMALL Hiatal hernia   HERNIA REPAIR       Prior to Admission medications   Medication Sig Start Date End Date Taking? Authorizing Provider  acetaminophen (TYLENOL) 325 MG tablet Take 2 tablets (650 mg total) by mouth every 6 (six) hours as needed. 02/02/21  Yes Sharman Cheek, MD  naproxen (NAPROSYN) 500 MG tablet Take 1 tablet (500 mg total) by mouth 2 (two) times daily with a meal. 02/02/21  Yes Sharman Cheek, MD  amLODipine (NORVASC) 5 MG tablet Take 1 tablet (5 mg total) by mouth daily. 07/10/20  07/10/21  Sherrie Mustache, Roselyn Bering, PA-C  docusate sodium (COLACE) 100 MG capsule Take 1 tablet once or twice daily as needed for constipation while taking narcotic pain medicine 08/01/20   Jene Every, MD  ondansetron (ZOFRAN ODT) 4 MG disintegrating tablet Allow 1-2 tablets to dissolve in your mouth every 8 hours as needed for nausea/vomiting 08/01/20   Jene Every, MD  oxyCODONE-acetaminophen (PERCOCET) 5-325 MG tablet Take 2 tablets by mouth every 6 (six) hours as needed for severe pain. 08/01/20   Jene Every, MD  pantoprazole (PROTONIX) 40 MG tablet Take 1 tablet (40 mg total) by mouth 2 (two) times daily. 10/28/11 10/27/12  Erick Blinks, MD  sucralfate (CARAFATE) 1 g tablet Take 1 tablet (1 g total) by mouth 4 (four) times daily -  with meals and at bedtime. 03/27/17 03/27/18  Governor Rooks, MD  tamsulosin (FLOMAX) 0.4 MG CAPS capsule Take 1 tablet by mouth daily until you pass the kidney stone or no longer have symptoms 08/01/20   Jene Every, MD     Allergies Patient has no known allergies.   Family History  Problem Relation Age of Onset   Dementia Mother    Heart failure Mother    Hypertension Mother    Diabetes Mother    Cancer Father 54       lung   Hypertension Father    Cancer Sister 44       ob/gyn   Hypertension Sister     Social History  Social History   Tobacco Use   Smoking status: Never   Smokeless tobacco: Never  Vaping Use   Vaping Use: Never used  Substance Use Topics   Alcohol use: Yes    Alcohol/week: 5.0 standard drinks    Types: 5 Standard drinks or equivalent per week    Comment: occ   Drug use: No    Review of Systems  Constitutional:   No fever or chills.  ENT:   No sore throat. No rhinorrhea. Cardiovascular:   No chest pain or syncope. Respiratory:   No dyspnea or cough. Gastrointestinal:   Negative for abdominal pain, vomiting and diarrhea.  Musculoskeletal: Left shoulder pain as above All other systems reviewed and are negative except as  documented above in ROS and HPI.  ____________________________________________   PHYSICAL EXAM:  VITAL SIGNS: ED Triage Vitals  Enc Vitals Group     BP 02/02/21 0647 (!) 178/120     Pulse Rate 02/02/21 0647 99     Resp 02/02/21 0647 20     Temp 02/02/21 0647 98.3 F (36.8 C)     Temp Source 02/02/21 0647 Oral     SpO2 02/02/21 0647 99 %     Weight 02/02/21 0642 245 lb (111.1 kg)     Height 02/02/21 0642 6\' 1"  (1.854 m)     Head Circumference --      Peak Flow --      Pain Score 02/02/21 0642 10     Pain Loc --      Pain Edu? --      Excl. in GC? --     Vital signs reviewed, nursing assessments reviewed.   Constitutional:   Alert and oriented. Non-toxic appearance. Eyes:   Conjunctivae are normal. EOMI. ENT      Head:   Normocephalic and atraumatic.            Neck:   No meningismus. Full ROM.  No midline spinal tenderness.  No tenderness or pulsatile mass along carotid or vertebral artery course Hematological/Lymphatic/Immunilogical:   No cervical lymphadenopathy. Cardiovascular:   RRR. Symmetric bilateral radial  pulses.  . Cap refill less than 2 seconds. Respiratory:   Normal respiratory effort without tachypnea/retractions. Breath sounds are clear and equal bilaterally. No wheezes/rales/rhonchi. Musculoskeletal:   Normal range of motion in all extremities.  No edema.  There is pronounced muscular tenderness on the left posterior thorax in the rhomboid muscle group which reproduces his pain.  This is also reproduced by adducting the left arm across the chest to stretch this.,  And by active stress by having him attempt to abduct the left arm against resistance. Neurologic:   Normal speech and language.  Motor grossly intact. No acute focal neurologic deficits are appreciated.  Skin:    Skin is warm, dry and intact. No rash noted.  No wounds.  ____________________________________________    LABS (pertinent positives/negatives) (all labs ordered are listed, but only  abnormal results are displayed) Labs Reviewed - No data to display ____________________________________________   EKG  ____________________________________________    RADIOLOGY  No results found.  ____________________________________________   PROCEDURES Procedures  ____________________________________________  CLINICAL IMPRESSION / ASSESSMENT AND PLAN / ED COURSE  Pertinent labs & imaging results that were available during my care of the patient were reviewed by me and considered in my medical decision making (see chart for details).  Timothy Novak was evaluated in Emergency Department on 02/02/2021 for the symptoms described in the history of present illness. He  was evaluated in the context of the global COVID-19 pandemic, which necessitated consideration that the patient might be at risk for infection with the SARS-CoV-2 virus that causes COVID-19. Institutional protocols and algorithms that pertain to the evaluation of patients at risk for COVID-19 are in a state of rapid change based on information released by regulatory bodies including the CDC and federal and state organizations. These policies and algorithms were followed during the patient's care in the ED.   Patient presents with left shoulder pain which appears to be related to repetitive use and strenuous muscle activity against resistance at work.  Doubt ACS PE dissection pneumothorax pneumonia pulmonary edema CHF, carotid artery or vertebral artery injury, stroke or other vascular event.  Recommend NSAIDs and heat therapy, work note provided for light duty.  Stable for discharge.      ____________________________________________   FINAL CLINICAL IMPRESSION(S) / ED DIAGNOSES    Final diagnoses:  Thoracic myofascial strain, initial encounter     ED Discharge Orders          Ordered    naproxen (NAPROSYN) 500 MG tablet  2 times daily with meals        02/02/21 0748    acetaminophen (TYLENOL) 325 MG tablet   Every 6 hours PRN        02/02/21 0748            Portions of this note were generated with dragon dictation software. Dictation errors may occur despite best attempts at proofreading.   Sharman Cheek, MD 02/02/21 [phone]

## 2022-12-11 IMAGING — CT CT ABD-PELV W/ CM
2 of 5 series · 16 of 46 positions shown, 18 images · IV contrast (APPLIED)
Comparison: 06/04/2019

CLINICAL DATA: Constant and sharp generalized abdominal pain for
3-4 days, possible diverticulitis

EXAM:
CT ABDOMEN AND PELVIS WITH CONTRAST
TECHNIQUE: Multidetector CT imaging of the abdomen and pelvis was performed
using the standard protocol following bolus administration of
intravenous contrast.
CONTRAST:  100mL OMNIPAQUE IOHEXOL 300 MG/ML  SOLN

[Series 2: routine abd/pel with · axial · 0.84mm/px · z∈[-1105,-600]mm · 13 of 113 slices shown, 15 images]
[im 6/113  soft-tissue]
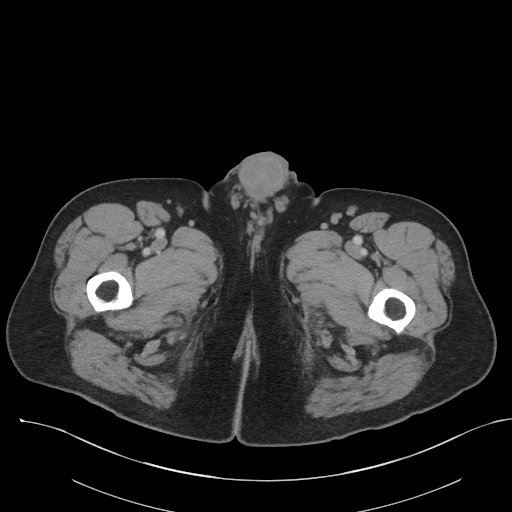
[im 6/113  bone]
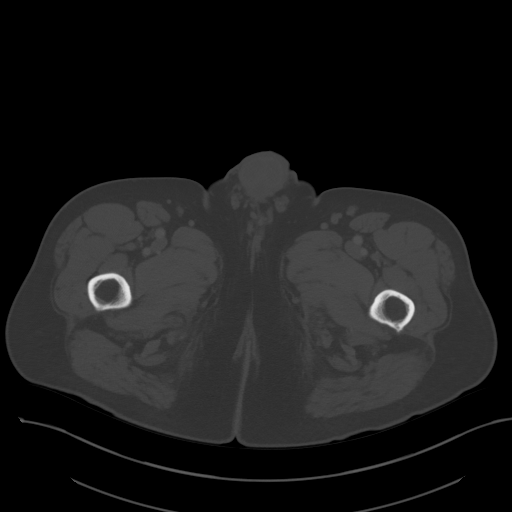
[im 18/113  soft-tissue]
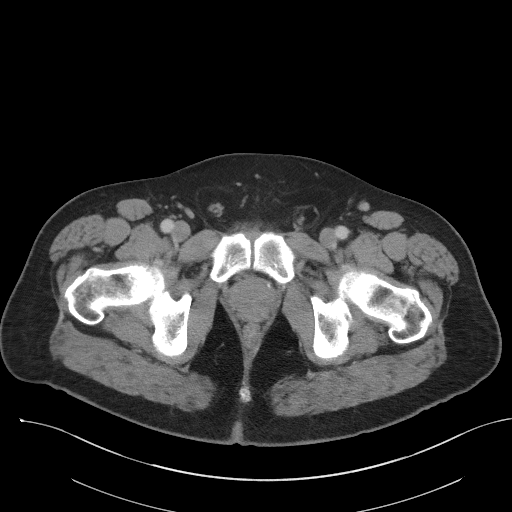
[im 24/113  soft-tissue]
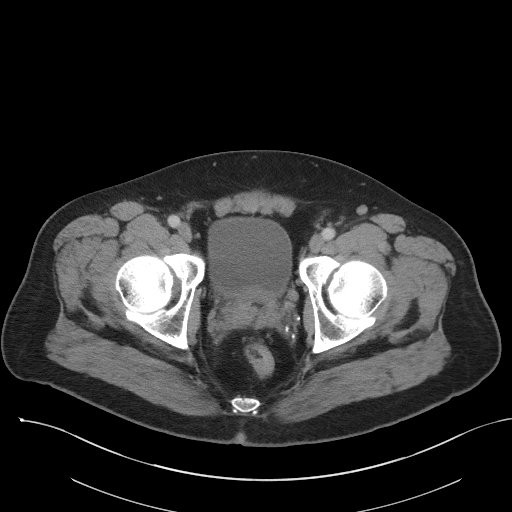
[im 30/113  soft-tissue]
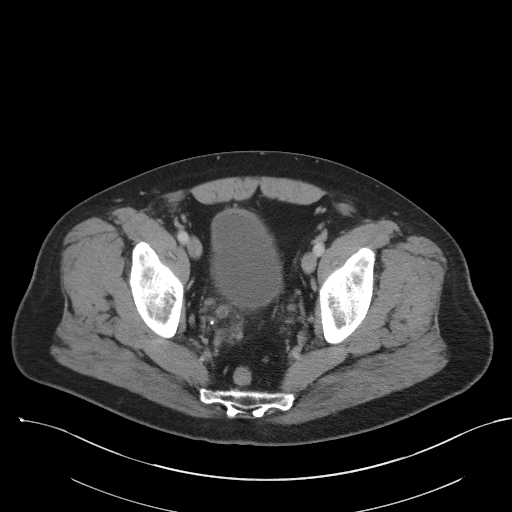
[im 42/113  soft-tissue]
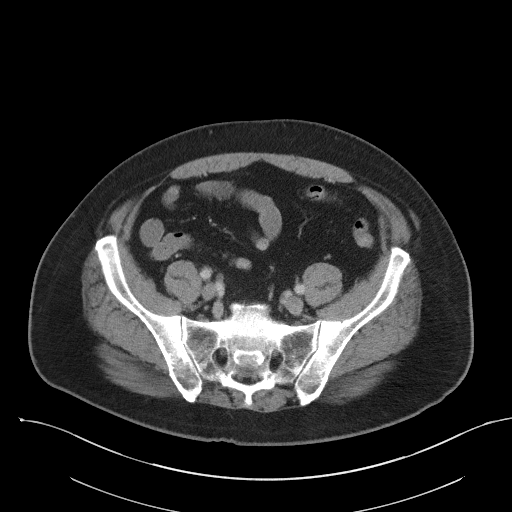
[im 48/113  soft-tissue]
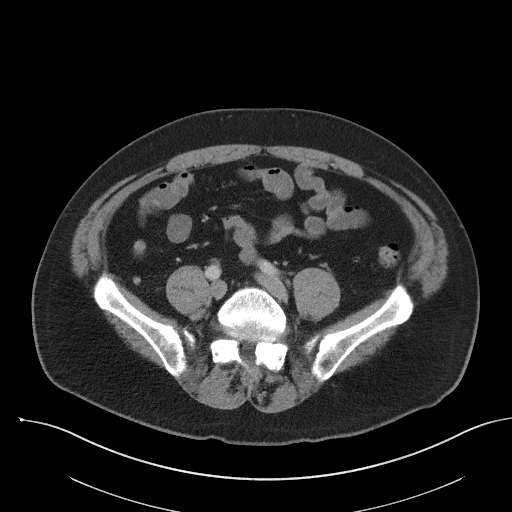
[im 59/113  soft-tissue]
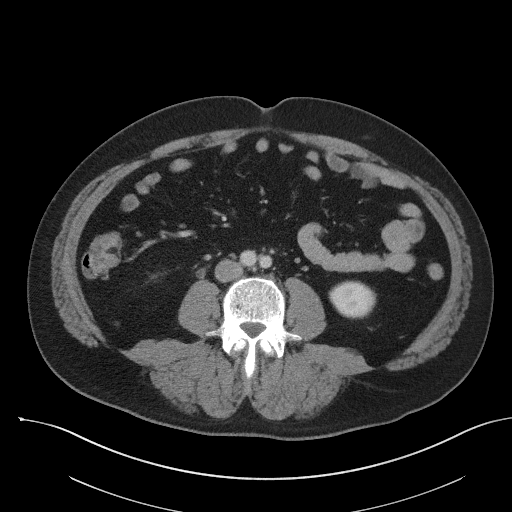
[im 65/113  soft-tissue]
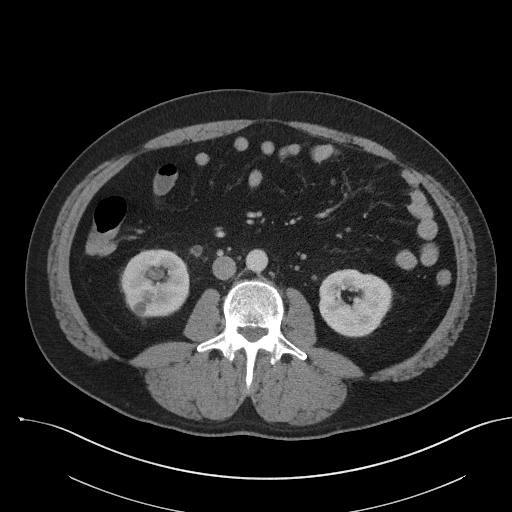
[im 71/113  soft-tissue]
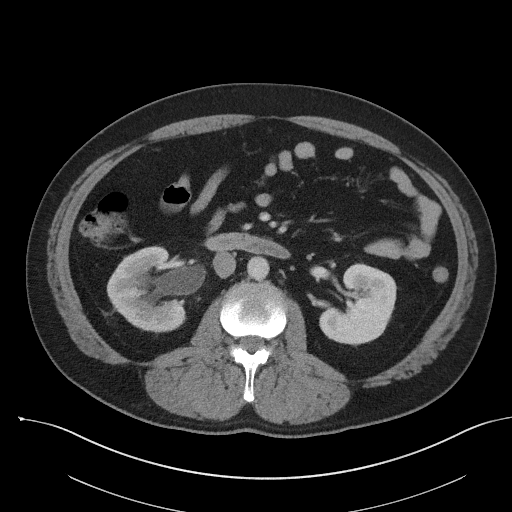
[im 71/113  bone]
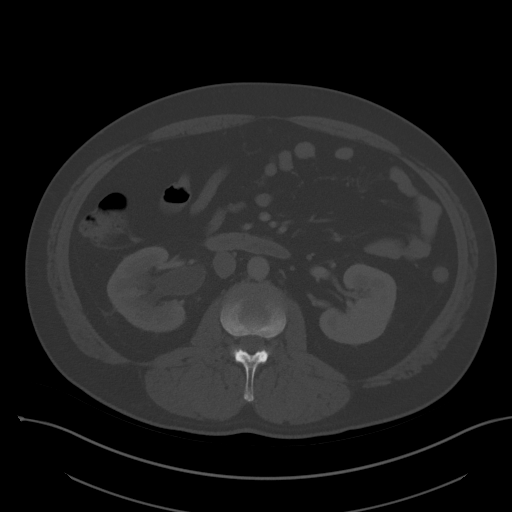
[im 83/113  soft-tissue]
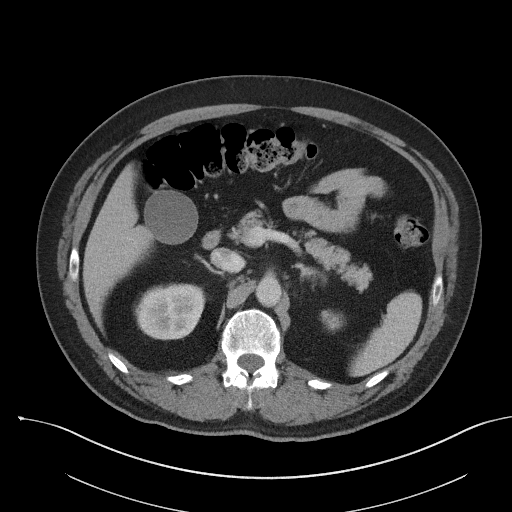
[im 89/113  soft-tissue]
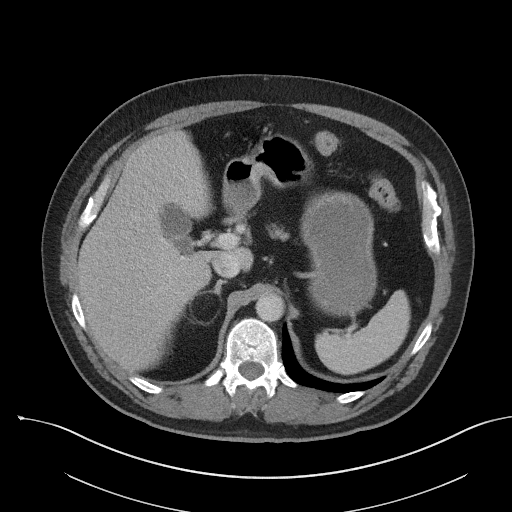
[im 95/113  soft-tissue]
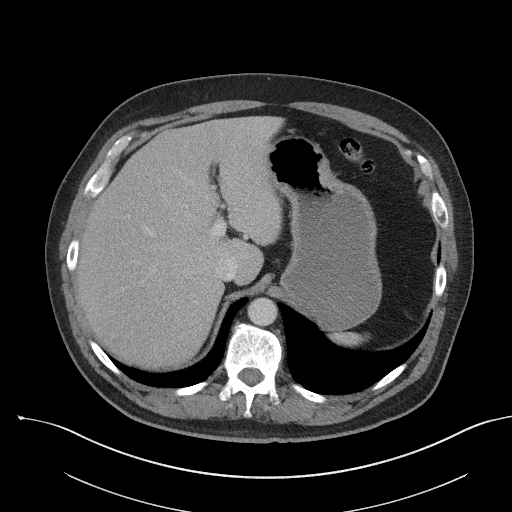
[im 107/113  soft-tissue]
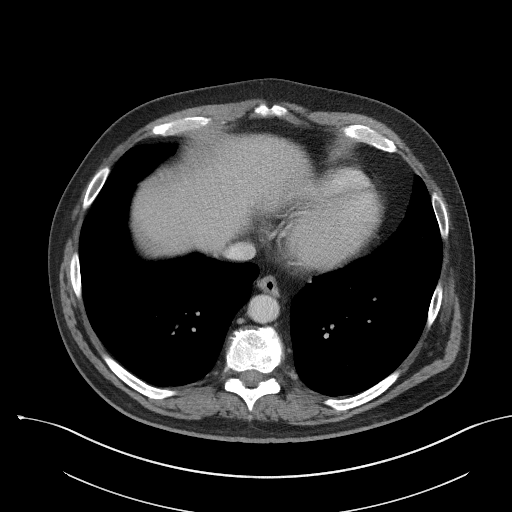

[Series 5: coronal st · coronal · 0.83mm/px · 3 of 107 slices shown]
[im 36/107  soft-tissue]
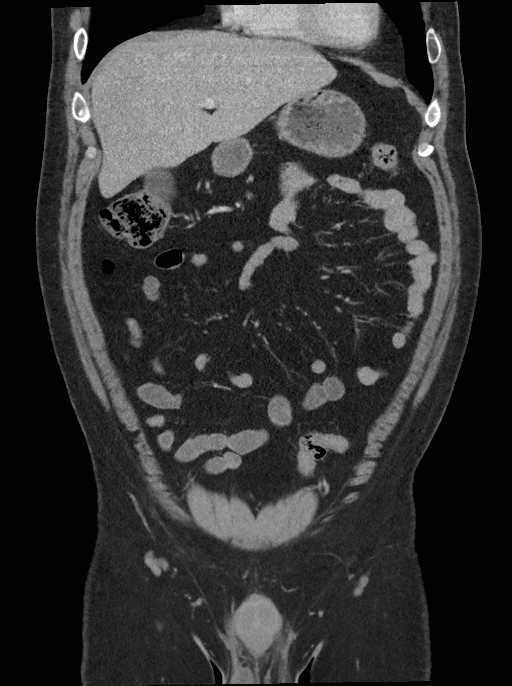
[im 48/107  soft-tissue]
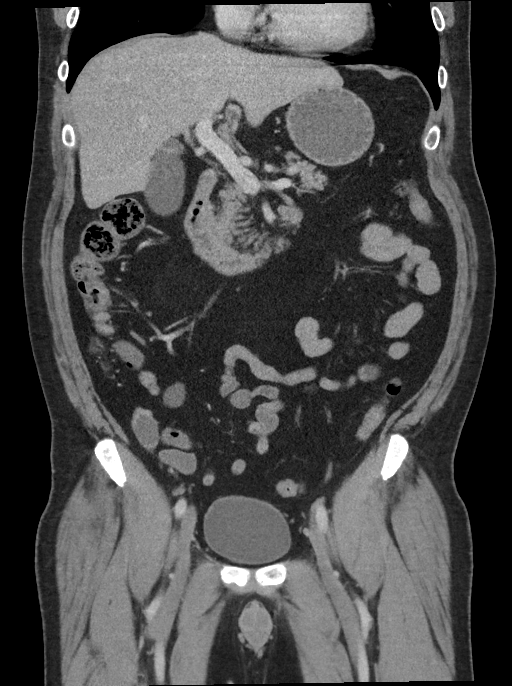
[im 59/107  soft-tissue]
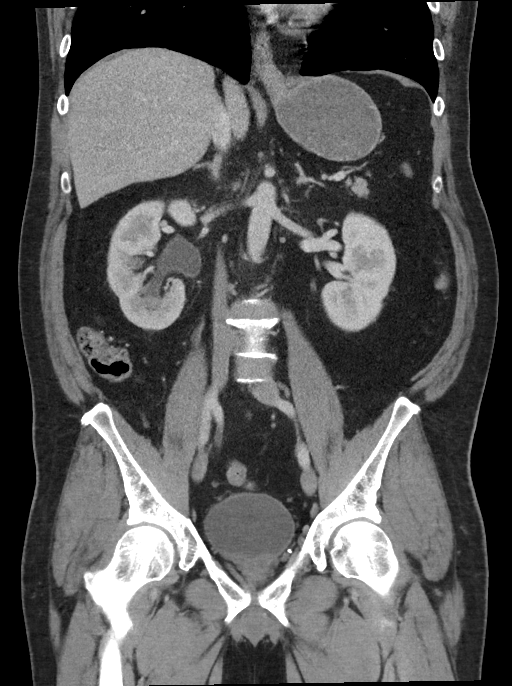

[16 of 46 positions shown; findings below may reference images not displayed]

FINDINGS: Lower chest:  No contributory findings.

Hepatobiliary: No focal liver abnormality.No evidence of biliary
obstruction or stone.

Pancreas: Unremarkable.

Spleen: Unremarkable.

Adrenals/Urinary Tract: 2.8 cm fatty right adrenal mass consistent
with myelolipoma . Right hydroureteronephrosis due to a 3 mm stone
at the UVJ where there is probable ureteral and bladder base edema.
Small bilateral renal cystic densities.

Stomach/Bowel:  No obstruction. No visible bowel inflammation

Vascular/Lymphatic: No acute vascular abnormality. No mass or
adenopathy.

Reproductive:No pathologic findings.

Other: No ascites or pneumoperitoneum.

Musculoskeletal: No acute abnormalities.
IMPRESSION: Obstructing 3 mm stone at the right UVJ.

## 2024-07-08 ENCOUNTER — Ambulatory Visit: Payer: Self-pay | Admitting: Family Medicine
